# Patient Record
Sex: Male | Born: 1960
Health system: Southern US, Community
[De-identification: ages and names within clinical notes are randomized; demographics above are authoritative.]

## PROBLEM LIST (undated history)

## (undated) DIAGNOSIS — J449 Chronic obstructive pulmonary disease, unspecified: Secondary | ICD-10-CM

## (undated) DIAGNOSIS — E785 Hyperlipidemia, unspecified: Secondary | ICD-10-CM

## (undated) DIAGNOSIS — T8859XA Other complications of anesthesia, initial encounter: Secondary | ICD-10-CM

## (undated) DIAGNOSIS — T4145XA Adverse effect of unspecified anesthetic, initial encounter: Secondary | ICD-10-CM

## (undated) DIAGNOSIS — I10 Essential (primary) hypertension: Secondary | ICD-10-CM

## (undated) HISTORY — DX: Essential (primary) hypertension: I10

## (undated) HISTORY — DX: Chronic obstructive pulmonary disease, unspecified: J44.9

## (undated) HISTORY — DX: Hyperlipidemia, unspecified: E78.5

---
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
## 2004-07-11 ENCOUNTER — Ambulatory Visit: Payer: Self-pay | Admitting: Internal Medicine

## 2009-06-20 ENCOUNTER — Ambulatory Visit: Payer: Self-pay | Admitting: Internal Medicine

## 2009-06-20 DIAGNOSIS — J449 Chronic obstructive pulmonary disease, unspecified: Secondary | ICD-10-CM | POA: Insufficient documentation

## 2009-06-27 ENCOUNTER — Encounter: Payer: Self-pay | Admitting: Internal Medicine

## 2009-07-18 ENCOUNTER — Ambulatory Visit: Payer: Self-pay | Admitting: Internal Medicine

## 2010-04-09 NOTE — Assessment & Plan Note (Signed)
Summary: Pulmonary/ new pt eval for cough > sob > try symbicort 160   Primary Provider/Referring Provider:  none  CC:  Dyspnea.  Pt c/o increased SOB x several wks.  He states that he gets SOB during the day with or without activity.  .  History of Present Illness: 50 yowm quit smoking April 2011  07/2004 pulm eval for severe cough rec prilosec at onset cough and stop smoking > did neither.  June 20, 2009 cc sev years severe variably prod cough and sob,  use proaire and sometimes  advair with variable control  then worse x one month now advair daily and sev times a day proaire also abx also prednisone shots > not convinced he's better with sob sometimes sitting still, never while sleeping,  overall activity  better since quit smoking including yardwork as recently as April 10,   assoc with heartburn but no ex or pleuritic cp.  no purulent sputum.  Pt also denies any obvious fluctuation in symptoms with weather or environmental change or other alleviating or aggravating factors.       Current Medications (verified): 1)  Advair Diskus 250-50 Mcg/dose Aepb (Fluticasone-Salmeterol) .Marland Kitchen.. 1 Puff Two Times A Day 2)  Proair Hfa 108 (90 Base) Mcg/act Aers (Albuterol Sulfate) .... 2 Puffs Every 4-6 Hours As Needed  Allergies (verified): No Known Drug Allergies  Past History:  Past Medical History: COPD    - Alpha 1 genotype sent June 20, 2009 >>  Family History: Prostate CA- Father Heart dz- Father  Social History: Married with no children Barber ETOH daily Former smoker.  Quit 06-15-09.  Smoked for 35 yrs up to 2 ppd.  Review of Systems       The patient complains of shortness of breath with activity, shortness of breath at rest, productive cough, acid heartburn, nasal congestion/difficulty breathing through nose, and sneezing.  The patient denies non-productive cough, coughing up blood, chest pain, irregular heartbeats, indigestion, loss of appetite, weight change, abdominal pain,  difficulty swallowing, sore throat, tooth/dental problems, headaches, itching, ear ache, anxiety, depression, hand/feet swelling, joint stiffness or pain, rash, change in color of mucus, and fever.    Vital Signs:  Patient profile:   50 year old male Height:      70 inches Weight:      142.13 pounds BMI:     20.47 O2 Sat:      95 % on Room air Temp:     97.6 degrees F oral Pulse rate:   71 / minute BP sitting:   138 / 76  (left arm)  Vitals Entered By: Vernie Murders (June 20, 2009 1:42 PM)  O2 Flow:  Room air  Physical Exam  Additional Exam:  anxious wm nad  wt 142 June 20, 2009 HEENT mild turbinate edema.  Oropharynx no thrush or excess pnd or cobblestoning.  No JVD or cervical adenopathy. Mild accessory muscle hypertrophy. Trachea midline, nl thryroid. Chest was hyperinflated by percussion with diminished breath sounds and moderate increased exp time without wheeze. Hoover sign positive at mid inspiration. Regular rate and rhythm without murmur gallop or rub or increase P2 or edema.  Abd: no hsm, nl excursion. Ext warm without cyanosis or clubbing.     CXR  Procedure date:  06/20/2009  Findings:      Comparison: 07/11/2004.   Findings: Normal cardiomediastinal silhouette.  Hyperaeration of the lungs.  No acute pulmonary process.  Mild spondylosis of the dorsal spine.   IMPRESSION: COPD.  No acute  chest findings  Impression & Recommendations:  Problem # 1:  COPD UNSPECIFIED (ICD-496) DDX of  difficult airways managment all start with A and  include Adherence, Ace Inhibitors, Acid Reflux, Active Sinus Disease, Alpha 1 Antitripsin deficiency, Anxiety masquerading as Airways dz,  ABPA,  allergy(esp in young), Aspiration (esp in elderly), Adverse effects of DPI,  Active smokers, plus one B  = Beta blocker use..    Adherence:  did not follow prev recs to start ppi at onset of cough but did reportedly stop smoking  Adverse effects of dpi :  having breakthru symptoms on  advair  I spent extra time with the patient today explaining optimal mdi  technique.  This improved from  25 -75% June 20, 2009   Medications Added to Medication List This Visit: 1)  Advair Diskus 250-50 Mcg/dose Aepb (Fluticasone-salmeterol) .Marland Kitchen.. 1 puff two times a day 2)  Proair Hfa 108 (90 Base) Mcg/act Aers (Albuterol sulfate) .... 2 puffs every 4-6 hours as needed 3)  Symbicort 160-4.5 Mcg/act Aero (Budesonide-formoterol fumarate) .... 2 puffs first thing  in am and 2 puffs again in pm about 12 hours later 4)  Dexilant 60 Mg Cpdr (Dexlansoprazole) .... Take  one 30-60 min before first meal of the day  Complete Medication List: 1)  Symbicort 160-4.5 Mcg/act Aero (Budesonide-formoterol fumarate) .... 2 puffs first thing  in am and 2 puffs again in pm about 12 hours later 2)  Dexilant 60 Mg Cpdr (Dexlansoprazole) .... Take  one 30-60 min before first meal of the day  Other Orders: New Patient Level V (74259) T-2 View CXR (71020TC)  Patient Instructions: 1)  Stop proaire and advair 2)  Symbicort 160 2 puffs first thing  in am and 2 puffs again in pm about 12 hours later  3)  Dexilant 60 Take  one 30-60 min before first meal of the day  4)  Work on inhaler technique:  relax and blow all the way out then take a nice smooth deep breath back in, triggering the inhaler at same time you start breathing in 5)  Please schedule a follow-up appointment in 4 weeks with PFt's 6)  GERD (REFLUX)  is a common cause of respiratory symptoms. It commonly presents without heartburn and can be treated with medication, but also with lifestyle changes including avoidance of late meals, excessive alcohol, smoking cessation, and avoid fatty foods, chocolate, peppermint, colas, red wine, and acidic juices such as orange juice. NO MINT OR MENTHOL PRODUCTS SO NO COUGH DROPS  7)  USE SUGARLESS CANDY INSTEAD (jolley ranchers)  8)  NO OIL BASED VITAMINS

## 2010-04-09 NOTE — Assessment & Plan Note (Signed)
Summary: Pulmonary/ summary f/u ov hfa now 90% after coaching   Primary Provider/Referring Provider:  none  CC:  4 wk followup with PFT's.  Pt states that his breathing is much improved on symbicort.  He denies any complaints today.Marland Kitchen  History of Present Illness: 55 yowm quit smoking April 2011 with Moderate COPD by PFT's Jul 18, 2009   07/2004 pulm eval for severe cough rec prilosec at onset cough and stop smoking > did neither.  June 20, 2009 cc sev years severe variably prod cough and sob,  use proaire and sometimes  advair with variable control  then worse x one month now advair daily and sev times a day proaire also abx also prednisone shots > not convinced he's better with sob sometimes sitting still, never while sleeping,  overall activity  better since quit smoking including yardwork as recently as April 10,   assoc with heartburn but no ex or pleuritic cp.  no purulent sputum.  Stop proaire and advair Symbicort 160 2 puffs first thing  in am and 2 puffs again in pm about 12 hours later  Dexilant 60 Take  one 30-60 min before first meal of the day  Work on inhaler technique:  relax and blow all the way out then take a nice smooth deep breath back in, triggering the inhaler at same time you start breathing in  Jul 18, 2009 4 wk followup with PFT's.  Pt states that his breathing is much improved on symbicort.  He denies any complaints today. not consistent with either ppi or symbicort but when used symbicort even if just took one puff did not have the tightness in chest with exertion.  Pt denies any significant sore throat, dysphagia, itching, sneezing,  nasal congestion or excess secretions,  fever, chills, sweats, unintended wt loss, pleuritic or exertional cp, hempoptysis, change in activity tolerance  orthopnea pnd or leg swelling Pt also denies any obvious fluctuation in symptoms with weather or environmental change or other alleviating or aggravating factors.       Current Medications  (verified): 1)  Symbicort 160-4.5 Mcg/act  Aero (Budesonide-Formoterol Fumarate) .... 2 Puffs First Thing  in Am and 2 Puffs Again in Pm About 12 Hours Later 2)  Dexilant 60 Mg Cpdr (Dexlansoprazole) .... Take  One 30-60 Min Before First Meal of The Day  Allergies (verified): No Known Drug Allergies  Past History:  Past Medical History: COPD    - Alpha 1 genotype sent June 20, 2009 >> negative     - PFT's Jul 18, 2009 FEV1 2.70 (76) ratio 55 and no response to B2 p am symbicort and DLC0 75%  Vital Signs:  Patient profile:   50 year old male Weight:      142 pounds O2 Sat:      98 % on Room air Temp:     98.6 degrees F oral Pulse rate:   66 / minute BP sitting:   130 / 70  (left arm)  Vitals Entered By: Vernie Murders (Jul 18, 2009 1:26 PM)  O2 Flow:  Room air  Physical Exam  Additional Exam:  anxious wm nad  wt 142 June 20, 2009 > 142 Jul 18, 2009  HEENT mild turbinate edema.  Oropharynx no thrush or excess pnd or cobblestoning.  No JVD or cervical adenopathy. Mild accessory muscle hypertrophy. Trachea midline, nl thryroid. Chest was hyperinflated by percussion with diminished breath sounds and moderate increased exp time without wheeze. Hoover sign positive at mid inspiration.  Regular rate and rhythm without murmur gallop or rub or increase P2 or edema.  Abd: no hsm, nl excursion. Ext warm without cyanosis or clubbing.     Impression & Recommendations:  Problem # 1:  COPD UNSPECIFIED (ICD-496) GOLD II severity with reversibility suggested by symbicort dependency whereas didn't think advair or ppi really helped symptoms.   I took this opportunity to educate the patient regarding the consequences of smoking in airway disorders based on all the data we have from the multiple national lung health studies indicating that smoking cessation, not choice of inhalers or physicians, is the most important aspect of care.    Ok to take symbicort as needed in this setting, the way it is  used in [place].  I spent extra time with the patient today explaining optimal mdi  technique.  This improved from  75-90%  Other Orders: Est. Patient Level IV (16109)  Clinical Reports Reviewed:  CXR:  06/20/2009: CXR Results:  Comparison: 07/11/2004.   Findings: Normal cardiomediastinal silhouette.  Hyperaeration of the lungs.  No acute pulmonary process.  Mild spondylosis of the dorsal spine.   IMPRESSION: COPD.  No acute chest findings   Patient Instructions: 1)  Stop dexilant 2)  GERD (REFLUX)  is a common cause of respiratory symptoms. It commonly presents without heartburn and can be treated with medication, but also with lifestyle changes including avoidance of late meals, excessive alcohol, smoking cessation, and avoid fatty foods, chocolate, peppermint, colas, red wine, and acidic juices such as orange juice. NO MINT OR MENTHOL PRODUCTS SO NO COUGH DROPS  3)  USE SUGARLESS CANDY INSTEAD (jolley ranchers)  4)  NO OIL BASED VITAMINS 5)  Continue Symbicort 160 2 puffs first thing  in am and 2 puffs again in pm about 12 hours later  6)  The key is do not smoke 7)  Return here if not satisfied with symbicort Prescriptions: SYMBICORT 160-4.5 MCG/ACT  AERO (BUDESONIDE-FORMOTEROL FUMARATE) 2 puffs first thing  in am and 2 puffs again in pm about 12 hours later  #1 x 11   Entered and Authorized by:   Nyoka Cowden MD   Signed by:   Nyoka Cowden MD on 07/18/2009   Method used:   Electronically to        [facility]  [phone]* (retail)       [address] [place], [place]  [address]       Ph: [phone] or [phone]       Fax: [phone]   RxID:   [id number]

## 2010-04-09 NOTE — Miscellaneous (Signed)
Summary: Orders Update pft charges  Clinical Lists Changes  Orders: Added new Service order of Carbon Monoxide diffusing w/capacity (94720) - Signed Added new Service order of Lung Volumes (94240) - Signed Added new Service order of Spirometry (Pre & Post) (94060) - Signed 

## 2010-07-10 ENCOUNTER — Telehealth: Payer: Self-pay | Admitting: Internal Medicine

## 2010-07-10 MED ORDER — BUDESONIDE-FORMOTEROL FUMARATE 160-4.5 MCG/ACT IN AERO: 2.0000 | INHALATION_SPRAY | Freq: Two times a day (BID) | RESPIRATORY_TRACT | Status: DC

## 2010-07-10 NOTE — Telephone Encounter (Signed)
Pt phoned stated that he was returning a call to triage he can be reached at [phone].Vedia Coffer

## 2010-07-10 NOTE — Telephone Encounter (Signed)
lmomtcb x1. Pt was last seen 07/18/09

## 2010-07-10 NOTE — Telephone Encounter (Signed)
Spoke w/ Stephen Davila and advised him rx was sent to pharmacy. Also advised sine last time Stephen Davila saw Dr. Sherene Sires was 05/12/09 he needed to make an OV for further refills. Stephen Davila is coming in 08/01/10 at 11:45. Nothing further was needed

## 2010-07-31 ENCOUNTER — Encounter: Payer: Self-pay | Admitting: Internal Medicine

## 2010-08-01 ENCOUNTER — Ambulatory Visit (INDEPENDENT_AMBULATORY_CARE_PROVIDER_SITE_OTHER): Payer: BC Managed Care – PPO | Admitting: Internal Medicine

## 2010-08-01 ENCOUNTER — Encounter: Payer: Self-pay | Admitting: Internal Medicine

## 2010-08-01 ENCOUNTER — Ambulatory Visit (INDEPENDENT_AMBULATORY_CARE_PROVIDER_SITE_OTHER)
Admission: RE | Admit: 2010-08-01 | Discharge: 2010-08-01 | Disposition: A | Discharge status: Home / Self Care | Payer: BC Managed Care – PPO | Source: Ambulatory Visit | Attending: Internal Medicine | Admitting: Internal Medicine

## 2010-08-01 VITALS — BP 110/60 | HR 80 | Temp 98.0°F | Ht 71.0 in | Wt 164.0 lb

## 2010-08-01 DIAGNOSIS — J449 Chronic obstructive pulmonary disease, unspecified: Secondary | ICD-10-CM

## 2010-08-01 DIAGNOSIS — J4489 Other specified chronic obstructive pulmonary disease: Secondary | ICD-10-CM

## 2010-08-01 MED ORDER — BUDESONIDE-FORMOTEROL FUMARATE 160-4.5 MCG/ACT IN AERO: 2.0000 | INHALATION_SPRAY | Freq: Two times a day (BID) | RESPIRATORY_TRACT | Status: DC

## 2010-08-01 NOTE — Progress Notes (Signed)
Subjective:     Patient ID: Stephen Davila, male   DOB: 1960-08-11, 50 y.o.   MRN: [id number]  HPI  64 yowm quit smoking April 2011 with Moderate COPD by PFT's Jul 18, 2009   07/2004 pulm eval for severe cough rec prilosec at onset cough and stop smoking > did neither.   June 20, 2009 cc sev years severe variably prod cough and sob, use proaire and sometimes advair with variable control then worse x one month now advair daily and sev times a day proaire also abx also prednisone shots > not convinced he's better with sob sometimes sitting still, never while sleeping, overall activity better since quit smoking including yardwork as recently as April 10, assoc with heartburn but no ex or pleuritic cp. no purulent sputum. Stop proaire and advair  Symbicort 160 2 puffs first thing in am and 2 puffs again in pm about 12 hours later  Dexilant 60 Take one 30-60 min before first meal of the day  Work on inhaler technique: relax and blow all the way out then take a nice smooth deep breath back in, triggering the inhaler at same time you start breathing in   Jul 18, 2009 4 wk followup with PFT's. Pt states that his breathing is much improved on symbicort. He denies any complaints today. not consistent with either ppi or symbicort but when used symbicort even if just took one puff did not have the tightness in chest with exertion.  rec   Stop dexilant   GERD (REFLUX) diet  Continue Symbicort 160 2 puffs first thing in am and 2 puffs again in pm about 12 hours later   The key is do not smoke   Return here if not satisfied with symbicort    08/01/2010 ov/ Wert satisfied with symbicort, occ misses the pm dose and notes occ am cough/congestion the next day only.  Not limited by sob.  Pt denies any significant sore throat, dysphagia, itching, sneezing,  nasal congestion or excess/ purulent secretions,  fever, chills, sweats, unintended wt loss, pleuritic or exertional cp, hempoptysis, orthopnea pnd or  leg swelling.    Also denies any obvious fluctuation of symptoms with weather or environmental changes or other aggravating or alleviating factors.  Sleeping ok without nocturnal  or early am exac of resp c/o's       Past Medical History:  COPD  - Alpha 1 genotype sent June 20, 2009 >> MM - PFT's Jul 18, 2009 FEV1 2.70 (76) ratio 55 and no response to B2 p am symbicort and DLC0 75%       Review of Systems     Objective:   Physical Exam  anxious wm nad  wt 142 June 20, 2009 > 142 Jul 18, 2009 > 164 08/01/2010  HEENT mild turbinate edema. Oropharynx no thrush or excess pnd or cobblestoning. No JVD or cervical adenopathy. Mild accessory muscle hypertrophy. Trachea midline, nl thryroid. Chest was hyperinflated by percussion with diminished breath sounds and moderate increased exp time without wheeze. Hoover sign positive at mid inspiration. Regular rate and rhythm without murmur gallop or rub or increase P2 or edema. Abd: no hsm, nl excursion. Ext warm without cyanosis or clubbing.     cxr 08/01/2010  Comparison: 06/20/2009  Findings: Stable evidence of mild COPD. No infiltrate, nodule,  edema or effusion identified. Heart size is normal.  IMPRESSION:  Stable COPD.  Assessment:      Plan:

## 2010-08-01 NOTE — Patient Instructions (Addendum)
Continue symbicort 160 2 puffs every 12 hours if needed for cough wheeze or short of breath  Maintaining off cigarettes is the key to longterm lung health - congratulations!  Rec establish with primary care doctor

## 2010-08-01 NOTE — Assessment & Plan Note (Signed)
He has no sign symptoms with GOLD II COPD s/p remote smoking cessation so unlikely he ever will  I reviewed the Flethcher curve with patient that basically indicates  if you quit smoking when your best day FEV1 is still well preserved it is highly unlikely you will progress to severe disease and informed the patient there was no medication on the market that has proven to change the curve or the likelihood of progression.  Therefore  maintaining abstinence from smoking  is the most important aspect of care, not choice of inhalers or for that matter, doctors.    Ok to use symbicort the way he is, namely prn  The proper method of use, as well as anticipated side effects, of this metered-dose inhaler are discussed and demonstrated to the patient. Improved to 90% with coaching

## 2010-08-07 ENCOUNTER — Telehealth: Payer: Self-pay | Admitting: Internal Medicine

## 2010-08-07 NOTE — Telephone Encounter (Signed)
Per Leslie's in-basket, she did indeed call patient to inform him of cxr results.  Called spoke with patient, advised of cxr results as stated by MW.  Pt verbalized his understanding.

## 2011-09-17 ENCOUNTER — Telehealth: Payer: Self-pay | Admitting: Internal Medicine

## 2011-09-17 DIAGNOSIS — J449 Chronic obstructive pulmonary disease, unspecified: Secondary | ICD-10-CM

## 2011-09-17 MED ORDER — BUDESONIDE-FORMOTEROL FUMARATE 160-4.5 MCG/ACT IN AERO: 2.0000 | INHALATION_SPRAY | Freq: Two times a day (BID) | RESPIRATORY_TRACT | Status: DC

## 2011-09-17 NOTE — Telephone Encounter (Signed)
Pls advise if okay to send refills for Symbicort until pt can be seen on 10/29/11 by Dr. Sanda Linger. Pt last seen in this office May 2012.

## 2011-09-17 NOTE — Telephone Encounter (Signed)
Refills sent. Pt is aware. Jennifer Castillo, CMA  

## 2011-09-17 NOTE — Telephone Encounter (Signed)
Ok for refills x 2 months on symbicort

## 2011-10-29 ENCOUNTER — Other Ambulatory Visit (INDEPENDENT_AMBULATORY_CARE_PROVIDER_SITE_OTHER): Payer: BC Managed Care – PPO

## 2011-10-29 ENCOUNTER — Encounter: Payer: Self-pay | Admitting: Internal Medicine

## 2011-10-29 ENCOUNTER — Ambulatory Visit (INDEPENDENT_AMBULATORY_CARE_PROVIDER_SITE_OTHER): Payer: BC Managed Care – PPO | Admitting: Internal Medicine

## 2011-10-29 VITALS — BP 118/74 | HR 68 | Temp 98.2°F | Resp 16 | Ht 71.0 in | Wt 163.5 lb

## 2011-10-29 DIAGNOSIS — Z0000 Encounter for general adult medical examination without abnormal findings: Secondary | ICD-10-CM

## 2011-10-29 DIAGNOSIS — Z23 Encounter for immunization: Secondary | ICD-10-CM

## 2011-10-29 DIAGNOSIS — J4489 Other specified chronic obstructive pulmonary disease: Secondary | ICD-10-CM

## 2011-10-29 DIAGNOSIS — Z13220 Encounter for screening for lipoid disorders: Secondary | ICD-10-CM

## 2011-10-29 DIAGNOSIS — J449 Chronic obstructive pulmonary disease, unspecified: Secondary | ICD-10-CM

## 2011-10-29 LAB — URINALYSIS, ROUTINE W REFLEX MICROSCOPIC
Bilirubin Urine: NEGATIVE
Ketones, ur: NEGATIVE
Leukocytes, UA: NEGATIVE
Urobilinogen, UA: 0.2 (ref 0.0–1.0)

## 2011-10-29 LAB — LIPID PANEL
Cholesterol: 230 mg/dL — ABNORMAL HIGH (ref 0–200)
Total CHOL/HDL Ratio: 4
VLDL: 41.4 mg/dL — ABNORMAL HIGH (ref 0.0–40.0)

## 2011-10-29 LAB — CBC WITH DIFFERENTIAL/PLATELET
Basophils Absolute: 0.2 10*3/uL — ABNORMAL HIGH (ref 0.0–0.1)
Eosinophils Absolute: 0.4 10*3/uL (ref 0.0–0.7)
HCT: 46.5 % (ref 39.0–52.0)
Hemoglobin: 15.8 g/dL (ref 13.0–17.0)
Lymphs Abs: 2.7 10*3/uL (ref 0.7–4.0)
MCHC: 33.9 g/dL (ref 30.0–36.0)
MCV: 97.2 fl (ref 78.0–100.0)
Neutro Abs: 4.9 10*3/uL (ref 1.4–7.7)
RDW: 12.3 % (ref 11.5–14.6)

## 2011-10-29 LAB — COMPREHENSIVE METABOLIC PANEL
ALT: 30 U/L (ref 0–53)
AST: 24 U/L (ref 0–37)
Alkaline Phosphatase: 50 U/L (ref 39–117)
Chloride: 102 mEq/L (ref 96–112)
Creatinine, Ser: 1.1 mg/dL (ref 0.4–1.5)
Total Bilirubin: 0.8 mg/dL (ref 0.3–1.2)

## 2011-10-29 LAB — TSH: TSH: 1.06 u[IU]/mL (ref 0.35–5.50)

## 2011-10-29 LAB — LDL CHOLESTEROL, DIRECT: Direct LDL: 136.4 mg/dL

## 2011-10-29 MED ORDER — BUDESONIDE-FORMOTEROL FUMARATE 160-4.5 MCG/ACT IN AERO: 2.0000 | INHALATION_SPRAY | Freq: Two times a day (BID) | RESPIRATORY_TRACT | Status: DC

## 2011-10-29 NOTE — Patient Instructions (Signed)
Health Maintenance, Males A healthy lifestyle and preventative care can promote health and wellness.  Maintain regular health, dental, and eye exams.   Eat a healthy diet. Foods like vegetables, fruits, whole grains, low-fat dairy products, and lean protein foods contain the nutrients you need without too many calories. Decrease your intake of foods high in solid fats, added sugars, and salt. Get information about a proper diet from your caregiver, if necessary.   Regular physical exercise is one of the most important things you can do for your health. Most adults should get at least 150 minutes of moderate-intensity exercise (any activity that increases your heart rate and causes you to sweat) each week. In addition, most adults need muscle-strengthening exercises on 2 or more days a week.    Maintain a healthy weight. The body mass index (BMI) is a screening tool to identify possible weight problems. It provides an estimate of body fat based on height and weight. Your caregiver can help determine your BMI, and can help you achieve or maintain a healthy weight. For adults 20 years and older:   A BMI below 18.5 is considered underweight.   A BMI of 18.5 to 24.9 is normal.   A BMI of 25 to 29.9 is considered overweight.   A BMI of 30 and above is considered obese.   Maintain normal blood lipids and cholesterol by exercising and minimizing your intake of saturated fat. Eat a balanced diet with plenty of fruits and vegetables. Blood tests for lipids and cholesterol should begin at age 20 and be repeated every 5 years. If your lipid or cholesterol levels are high, you are over 50, or you are a high risk for heart disease, you may need your cholesterol levels checked more frequently.Ongoing high lipid and cholesterol levels should be treated with medicines, if diet and exercise are not effective.   If you smoke, find out from your caregiver how to quit. If you do not use tobacco, do not start.    If you choose to drink alcohol, do not exceed 2 drinks per day. One drink is considered to be 12 ounces (355 mL) of beer, 5 ounces (148 mL) of wine, or 1.5 ounces (44 mL) of liquor.   Avoid use of street drugs. Do not share needles with anyone. Ask for help if you need support or instructions about stopping the use of drugs.   High blood pressure causes heart disease and increases the risk of stroke. Blood pressure should be checked at least every 1 to 2 years. Ongoing high blood pressure should be treated with medicines if weight loss and exercise are not effective.   If you are 45 to 53 years old, ask your caregiver if you should take aspirin to prevent heart disease.   Diabetes screening involves taking a blood sample to check your fasting blood sugar level. This should be done once every 3 years, after age 45, if you are within normal weight and without risk factors for diabetes. Testing should be considered at a younger age or be carried out more frequently if you are overweight and have at least 1 risk factor for diabetes.   Colorectal cancer can be detected and often prevented. Most routine colorectal cancer screening begins at the age of 50 and continues through age 75. However, your caregiver may recommend screening at an earlier age if you have risk factors for colon cancer. On a yearly basis, your caregiver may provide home test kits to check for hidden   blood in the stool. Use of a small camera at the end of a tube, to directly examine the colon (sigmoidoscopy or colonoscopy), can detect the earliest forms of colorectal cancer. Talk to your caregiver about this at age 50, when routine screening begins. Direct examination of the colon should be repeated every 5 to 10 years through age 75, unless early forms of pre-cancerous polyps or small growths are found.   Hepatitis C blood testing is recommended for all people born from 1945 through 1965 and any individual with known risks for  hepatitis C.   Healthy men should no longer receive prostate-specific antigen (PSA) blood tests as part of routine cancer screening. Consult with your caregiver about prostate cancer screening.   Testicular cancer screening is not recommended for adolescents or adult males who have no symptoms. Screening includes self-exam, caregiver exam, and other screening tests. Consult with your caregiver about any symptoms you have or any concerns you have about testicular cancer.   Practice safe sex. Use condoms and avoid high-risk sexual practices to reduce the spread of sexually transmitted infections (STIs).   Use sunscreen with a sun protection factor (SPF) of 30 or greater. Apply sunscreen liberally and repeatedly throughout the day. You should seek shade when your shadow is shorter than you. Protect yourself by wearing long sleeves, pants, a wide-brimmed hat, and sunglasses year round, whenever you are outdoors.   Notify your caregiver of new moles or changes in moles, especially if there is a change in shape or color. Also notify your caregiver if a mole is larger than the size of a pencil eraser.   A one-time screening for abdominal aortic aneurysm (AAA) and surgical repair of large AAAs by sound wave imaging (ultrasonography) is recommended for ages 65 to 75 years who are current or former smokers.   Stay current with your immunizations.  Document Released: 08/23/2007 Document Revised: 02/13/2011 Document Reviewed: 07/22/2010 [employer] Patient Information 2012 [employer]. 

## 2011-10-29 NOTE — Progress Notes (Signed)
  Subjective:    Patient ID: Stephen Davila, male    DOB: 1960-10-12, 51 y.o.   MRN: [id number]  HPI  New to me for a complete physical, he feels well and offers no complaints.  Review of Systems  Constitutional: Negative.   HENT: Negative.   Eyes: Negative.   Respiratory: Negative for cough, chest tightness, shortness of breath, wheezing and stridor.   Cardiovascular: Negative for chest pain, palpitations and leg swelling.  Gastrointestinal: Negative.   Genitourinary: Negative.  Negative for dysuria, urgency, frequency, decreased urine volume and difficulty urinating.  Musculoskeletal: Negative.   Skin: Negative.   Neurological: Negative.   Hematological: Negative.   Psychiatric/Behavioral: Negative.        Objective:   Physical Exam  Vitals reviewed. Constitutional: He is oriented to person, place, and time. He appears well-developed and well-nourished. No distress.  HENT:  Head: Normocephalic and atraumatic.  Mouth/Throat: Oropharynx is clear and moist. No oropharyngeal exudate.  Eyes: Conjunctivae are normal. Right eye exhibits no discharge. Left eye exhibits no discharge. No scleral icterus.  Neck: Normal range of motion. Neck supple. No JVD present. No tracheal deviation present. No thyromegaly present.  Cardiovascular: Normal rate, regular rhythm, normal heart sounds and intact distal pulses.  Exam reveals no gallop and no friction rub.   No murmur heard. Pulmonary/Chest: Effort normal and breath sounds normal. No stridor. No respiratory distress. He has no wheezes. He has no rales. He exhibits no tenderness.  Abdominal: Soft. Bowel sounds are normal. He exhibits no distension and no mass. There is no tenderness. There is no rebound and no guarding. Hernia confirmed negative in the right inguinal area and confirmed negative in the left inguinal area.  Genitourinary: Rectum normal, prostate normal, testes normal and penis normal. Rectal exam shows no external  hemorrhoid, no internal hemorrhoid, no fissure, no mass, no tenderness and anal tone normal. Guaiac negative stool. Prostate is not enlarged and not tender. Right testis shows no mass, no swelling and no tenderness. Right testis is descended. Left testis shows no mass, no swelling and no tenderness. Left testis is descended. Circumcised. No penile erythema or penile tenderness. No discharge found.  Musculoskeletal: Normal range of motion. He exhibits no edema.  Lymphadenopathy:    He has no cervical adenopathy.       Right: No inguinal adenopathy present.       Left: No inguinal adenopathy present.  Neurological: He is oriented to person, place, and time.  Skin: Skin is warm, dry and intact. No abrasion and no rash noted. He is not diaphoretic. No pallor.  Psychiatric: He has a normal mood and affect. His behavior is normal. Judgment and thought content normal.          Assessment & Plan:

## 2011-10-29 NOTE — Assessment & Plan Note (Signed)
Exam done, vaccines were updated, labs ordered, pt ed material was given 

## 2011-10-30 ENCOUNTER — Encounter: Payer: Self-pay | Admitting: Internal Medicine

## 2012-03-06 ENCOUNTER — Ambulatory Visit (INDEPENDENT_AMBULATORY_CARE_PROVIDER_SITE_OTHER): Payer: BC Managed Care – PPO | Admitting: Physician Assistant

## 2012-03-06 VITALS — BP 159/91 | HR 87 | Temp 100.0°F | Resp 16 | Ht 71.0 in | Wt 168.2 lb

## 2012-03-06 DIAGNOSIS — R509 Fever, unspecified: Secondary | ICD-10-CM

## 2012-03-06 DIAGNOSIS — J029 Acute pharyngitis, unspecified: Secondary | ICD-10-CM

## 2012-03-06 DIAGNOSIS — R05 Cough: Secondary | ICD-10-CM

## 2012-03-06 DIAGNOSIS — J329 Chronic sinusitis, unspecified: Secondary | ICD-10-CM

## 2012-03-06 DIAGNOSIS — R059 Cough, unspecified: Secondary | ICD-10-CM

## 2012-03-06 LAB — POCT CBC
Granulocyte percent: 75.6 %G (ref 37–80)
HCT, POC: 50 % (ref 43.5–53.7)
Hemoglobin: 16 g/dL (ref 14.1–18.1)
Lymph, poc: 1.4 (ref 0.6–3.4)
MCH, POC: 32.4 pg — AB (ref 27–31.2)
MCHC: 32 g/dL (ref 31.8–35.4)
MCV: 101.3 fL — AB (ref 80–97)
MID (cbc): 1 — AB (ref 0–0.9)
MPV: 8.3 fL (ref 0–99.8)
POC Granulocyte: 7.5 — AB (ref 2–6.9)
POC LYMPH PERCENT: 14.4 %L (ref 10–50)
POC MID %: 10 %M (ref 0–12)
Platelet Count, POC: 296 10*3/uL (ref 142–424)
RBC: 4.94 M/uL (ref 4.69–6.13)
RDW, POC: 12.5 %
WBC: 9.9 10*3/uL (ref 4.6–10.2)

## 2012-03-06 LAB — POCT INFLUENZA A/B
Influenza A, POC: NEGATIVE
Influenza B, POC: NEGATIVE

## 2012-03-06 MED ORDER — AZITHROMYCIN 250 MG PO TABS: ORAL_TABLET | ORAL | Status: DC

## 2012-03-06 MED ORDER — IPRATROPIUM BROMIDE 0.03 % NA SOLN: 2.0000 | Freq: Two times a day (BID) | NASAL | Status: DC

## 2012-03-06 NOTE — Progress Notes (Signed)
  Subjective:    Patient ID: Stephen Davila, male    DOB: Jul 14, 1960, 51 y.o.   MRN: [id number]  HPI 51 year old male presents with 2 day history of nasal congestion, sinus tenderness, sore throat, and cough productive of mucous.  Denies SOB, wheezing, nausea, vomiting, headache, otalgia, or dizziness. Has not used any OTC medications for this.  Does take Symbicort daily for COPD.      Review of Systems  Constitutional: Positive for fever. Negative for chills.  HENT: Positive for congestion, sore throat, rhinorrhea and postnasal drip. Negative for ear pain and neck pain.   Respiratory: Positive for cough. Negative for chest tightness, shortness of breath and wheezing.   Gastrointestinal: Negative for nausea, vomiting and abdominal pain.  Skin: Negative for rash.  Neurological: Negative for headaches.       Objective:   Physical Exam  Constitutional: He is oriented to person, place, and time. He appears well-developed and well-nourished.  HENT:  Head: Normocephalic and atraumatic.  Right Ear: Hearing, tympanic membrane, external ear and ear canal normal.  Left Ear: Hearing, external ear and ear canal normal.  Mouth/Throat: Uvula is midline, oropharynx is clear and moist and mucous membranes are normal. No oropharyngeal exudate.  Eyes: Conjunctivae normal are normal.  Neck: Normal range of motion.  Cardiovascular: Normal rate, regular rhythm and normal heart sounds.   Pulmonary/Chest: Effort normal and breath sounds normal.  Lymphadenopathy:    He has no cervical adenopathy.  Neurological: He is alert and oriented to person, place, and time.  Psychiatric: He has a normal mood and affect. His behavior is normal. Thought content normal.          Assessment & Plan:   1. Cough  POCT Influenza A/B, POCT CBC, azithromycin (ZITHROMAX) 250 MG tablet  2. Fever, unspecified  POCT Influenza A/B, POCT CBC  3. Acute pharyngitis  POCT Influenza A/B  4. Sinusitis  ipratropium  (ATROVENT) 0.03 % nasal spray   Will cover with Zpack  Delsym OTC bid for cough mucinex as directed Atrovent NS bid to help with congestion Follow up if symptoms worsen or fail to improve.

## 2012-09-18 ENCOUNTER — Ambulatory Visit (INDEPENDENT_AMBULATORY_CARE_PROVIDER_SITE_OTHER): Payer: BC Managed Care – PPO | Admitting: Family Medicine

## 2012-09-18 ENCOUNTER — Ambulatory Visit: Payer: BC Managed Care – PPO

## 2012-09-18 VITALS — BP 122/80 | HR 70 | Temp 98.4°F | Resp 18 | Wt 171.0 lb

## 2012-09-18 DIAGNOSIS — M25519 Pain in unspecified shoulder: Secondary | ICD-10-CM

## 2012-09-18 DIAGNOSIS — M25512 Pain in left shoulder: Secondary | ICD-10-CM

## 2012-09-18 DIAGNOSIS — M542 Cervicalgia: Secondary | ICD-10-CM

## 2012-09-18 DIAGNOSIS — M5412 Radiculopathy, cervical region: Secondary | ICD-10-CM

## 2012-09-18 MED ORDER — PREDNISONE 20 MG PO TABS: ORAL_TABLET | ORAL | Status: DC

## 2012-09-18 MED ORDER — KETOROLAC TROMETHAMINE 60 MG/2ML IM SOLN
60.0000 mg | Freq: Once | INTRAMUSCULAR | Status: AC
  Administered 2012-09-18: 60 mg via INTRAMUSCULAR

## 2012-09-18 MED ORDER — TRAMADOL HCL 50 MG PO TABS: 50.0000 mg | ORAL_TABLET | Freq: Three times a day (TID) | ORAL | Status: DC | PRN

## 2012-09-18 NOTE — Patient Instructions (Addendum)
Your x-rays show that the degeneration between C5 and C6 has worsened slightly from the x-rays 3 years ago.  I think this is responsible for your pain.  You got an injection of pain medicine here that should last a few hours.  You can use tramadol every 8 hours as needed.  Begin the prednisone taper tomorrow morning.  I recommend making an appointment with your neurosurgeon so they can re-evaluate you as well.  If anything is worsening or not improving prior to seeing neurosurgery, please let us know.

## 2012-09-18 NOTE — Progress Notes (Signed)
Subjective:    Patient ID: Stephen Davila, male    DOB: Mar 22, 1960, 52 y.o.   MRN: [id number]  HPI   Stephen Davila is a pleasant 52 yr old male here complaining of left sided neck and left shoulder and arm pain.  He describes the pain as being new but has difficulty articulating when the pain began.  States he first noticed the pain last week but pain responded well to ibuprofen.  Yesterday the pain seems to have worsened and is no longer responding to ibuprofen.  He went to the chiropractor yesterday and still found no relief. Difficulty sleeping due to the pain.  Can't get comfortable.  Endorses fairly normal ROM though some movements cause more pain than others.  Putting the left arm above his head will relieve the pain briefly.  He states he has numbness in his left thumb x 3 yrs.  Saw a neurosurgeon 3 yrs ago who noted a possible bone spur at C5-C6.  Was told the numbness would resolve but it has note.  Paresthesias do not appear to be worsening.  He took ibuprofen 400mg  this morning.  Tried a hydrocodone but that caused him to be very nauseous.  He denies any sort of trauma to the neck/shoulder.  States he otherwise feels well today.     Review of Systems  Constitutional: Negative for fever and chills.  HENT: Negative.   Respiratory: Negative.   Cardiovascular: Negative.   Gastrointestinal: Negative.   Musculoskeletal: Positive for myalgias and arthralgias.  Skin: Negative.   Neurological: Positive for numbness (left thumb). Negative for weakness.       Objective:   Physical Exam  Vitals reviewed. Constitutional: He is oriented to person, place, and time. He appears well-developed and well-nourished. No distress.  HENT:  Head: Normocephalic and atraumatic.  Eyes: Conjunctivae are normal. No scleral icterus.  Neck: Normal range of motion.  Cardiovascular: Normal rate, regular rhythm and normal heart sounds.   Pulmonary/Chest: Effort normal and breath sounds normal. He has no  wheezes. He has no rales.  Musculoskeletal:       Right shoulder: Normal.       Left shoulder: He exhibits tenderness. He exhibits normal range of motion, no bony tenderness and no deformity.       Cervical back: He exhibits normal range of motion, no tenderness, no bony tenderness, no pain and no spasm.       Arms: Mild generalized TTP over the shoulder; no TTP over cervical spine, paraspinals, or trapezius; full AROM in all planes; strength 5/5; some increased pain with empty can test; hawkin's neg; drop arm neg  Neurological: He is alert and oriented to person, place, and time. He has normal strength. A sensory deficit (left thumb x 3 yrs) is present.  Skin: Skin is warm and dry.  Psychiatric: He has a normal mood and affect. His behavior is normal.    [facility] reading (PRIMARY) by  Dr. Clelia Croft - spondylosis C5-C6 with spurring, appears progressive from 2011 films        Assessment & Plan:  Radiculopathy of cervical spine - Plan: traMADol (ULTRAM) 50 MG tablet, predniSONE (DELTASONE) 20 MG tablet  Pain in joint, shoulder region, left - Plan: ketorolac (TORADOL) injection 60 mg, DG Cervical Spine Complete, traMADol (ULTRAM) 50 MG tablet, predniSONE (DELTASONE) 20 MG tablet  Pain, neck - Plan: traMADol (ULTRAM) 50 MG tablet, predniSONE (DELTASONE) 20 MG tablet    Stephen Davila is a 52 yr old male with new onset  neck/shoulder pain.  He has long standing numbness in his left thumb thought to be due to C5-C6 bone spur.  Paresthesias are not worsening, but plain films show progressive degeneration from last imaging in 2011.  Suspect this progression is causing his increased pain.  The shoulder joint itself is normal on exam, and he has full AROM and strength.  Will try a short prednisone taper and tramadol for pain relief as pt did not tolerate hydrocodone very well.  Encouraged him to follow up with neurosurg as he may require further intervention from them.  If worsening or not improving prior to  seeing neurosurg, pt to call or RTC.    Case discussed with Dr. Clelia Croft

## 2012-09-20 NOTE — Progress Notes (Signed)
Reviewed documentation and xray and agree w/ assessment and plan. Eva Shaw, MD MPH   

## 2012-10-01 HISTORY — PX: CERVICAL SPINE SURGERY: SHX589

## 2012-11-03 ENCOUNTER — Ambulatory Visit (INDEPENDENT_AMBULATORY_CARE_PROVIDER_SITE_OTHER): Payer: BC Managed Care – PPO

## 2012-11-03 ENCOUNTER — Telehealth: Payer: Self-pay | Admitting: Internal Medicine

## 2012-11-03 ENCOUNTER — Encounter: Payer: Self-pay | Admitting: Internal Medicine

## 2012-11-03 ENCOUNTER — Ambulatory Visit (INDEPENDENT_AMBULATORY_CARE_PROVIDER_SITE_OTHER): Payer: BC Managed Care – PPO | Admitting: Internal Medicine

## 2012-11-03 VITALS — BP 126/62 | HR 71 | Temp 98.6°F | Resp 16 | Wt 164.1 lb

## 2012-11-03 DIAGNOSIS — Z125 Encounter for screening for malignant neoplasm of prostate: Secondary | ICD-10-CM

## 2012-11-03 DIAGNOSIS — I2510 Atherosclerotic heart disease of native coronary artery without angina pectoris: Secondary | ICD-10-CM

## 2012-11-03 DIAGNOSIS — J449 Chronic obstructive pulmonary disease, unspecified: Secondary | ICD-10-CM

## 2012-11-03 DIAGNOSIS — E785 Hyperlipidemia, unspecified: Secondary | ICD-10-CM | POA: Insufficient documentation

## 2012-11-03 DIAGNOSIS — Z0000 Encounter for general adult medical examination without abnormal findings: Secondary | ICD-10-CM

## 2012-11-03 DIAGNOSIS — J4489 Other specified chronic obstructive pulmonary disease: Secondary | ICD-10-CM

## 2012-11-03 LAB — CBC WITH DIFFERENTIAL/PLATELET
Basophils Relative: 0.6 % (ref 0.0–3.0)
Eosinophils Absolute: 0.4 10*3/uL (ref 0.0–0.7)
Eosinophils Relative: 3.9 % (ref 0.0–5.0)
Hemoglobin: 14.7 g/dL (ref 13.0–17.0)
Lymphocytes Relative: 32.9 % (ref 12.0–46.0)
MCHC: 34.7 g/dL (ref 30.0–36.0)
Neutro Abs: 5.1 10*3/uL (ref 1.4–7.7)
RBC: 4.46 Mil/uL (ref 4.22–5.81)
WBC: 9.7 10*3/uL (ref 4.5–10.5)

## 2012-11-03 LAB — URINALYSIS, ROUTINE W REFLEX MICROSCOPIC
Specific Gravity, Urine: 1.015 (ref 1.000–1.030)
Total Protein, Urine: NEGATIVE
Urine Glucose: NEGATIVE

## 2012-11-03 LAB — FECAL OCCULT BLOOD, GUAIAC: Fecal Occult Blood: NEGATIVE

## 2012-11-03 MED ORDER — BUDESONIDE-FORMOTEROL FUMARATE 160-4.5 MCG/ACT IN AERO: 2.0000 | INHALATION_SPRAY | Freq: Two times a day (BID) | RESPIRATORY_TRACT | Status: DC

## 2012-11-03 MED ORDER — ROSUVASTATIN CALCIUM 10 MG PO TABS
10.0000 mg | ORAL_TABLET | Freq: Every day | ORAL | Status: DC
Start: 2012-11-03 — End: 2012-11-16

## 2012-11-03 MED ORDER — ASPIRIN EC 81 MG PO TBEC: 81.0000 mg | DELAYED_RELEASE_TABLET | Freq: Every day | ORAL | Status: DC

## 2012-11-03 NOTE — Telephone Encounter (Signed)
Patient is requesting a referral for a colonoscopy.

## 2012-11-03 NOTE — Assessment & Plan Note (Signed)
Start crestor and asa I have asked him to have an ETT to see if there is any concern for ischemia

## 2012-11-03 NOTE — Telephone Encounter (Signed)
Yes sir, he has an appointment today @ 3:15, this request came via mychart request

## 2012-11-03 NOTE — Patient Instructions (Signed)
Health Maintenance, Males A healthy lifestyle and preventative care can promote health and wellness.  Maintain regular health, dental, and eye exams.  Eat a healthy diet. Foods like vegetables, fruits, whole grains, low-fat dairy products, and lean protein foods contain the nutrients you need without too many calories. Decrease your intake of foods high in solid fats, added sugars, and salt. Get information about a proper diet from your caregiver, if necessary.  Regular physical exercise is one of the most important things you can do for your health. Most adults should get at least 150 minutes of moderate-intensity exercise (any activity that increases your heart rate and causes you to sweat) each week. In addition, most adults need muscle-strengthening exercises on 2 or more days a week.   Maintain a healthy weight. The body mass index (BMI) is a screening tool to identify possible weight problems. It provides an estimate of body fat based on height and weight. Your caregiver can help determine your BMI, and can help you achieve or maintain a healthy weight. For adults 20 years and older:  A BMI below 18.5 is considered underweight.  A BMI of 18.5 to 24.9 is normal.  A BMI of 25 to 29.9 is considered overweight.  A BMI of 30 and above is considered obese.  Maintain normal blood lipids and cholesterol by exercising and minimizing your intake of saturated fat. Eat a balanced diet with plenty of fruits and vegetables. Blood tests for lipids and cholesterol should begin at age 20 and be repeated every 5 years. If your lipid or cholesterol levels are high, you are over 50, or you are a high risk for heart disease, you may need your cholesterol levels checked more frequently.Ongoing high lipid and cholesterol levels should be treated with medicines, if diet and exercise are not effective.  If you smoke, find out from your caregiver how to quit. If you do not use tobacco, do not start.  If you  choose to drink alcohol, do not exceed 2 drinks per day. One drink is considered to be 12 ounces (355 mL) of beer, 5 ounces (148 mL) of wine, or 1.5 ounces (44 mL) of liquor.  Avoid use of street drugs. Do not share needles with anyone. Ask for help if you need support or instructions about stopping the use of drugs.  High blood pressure causes heart disease and increases the risk of stroke. Blood pressure should be checked at least every 1 to 2 years. Ongoing high blood pressure should be treated with medicines if weight loss and exercise are not effective.  If you are 45 to 54 years old, ask your caregiver if you should take aspirin to prevent heart disease.  Diabetes screening involves taking a blood sample to check your fasting blood sugar level. This should be done once every 3 years, after age 45, if you are within normal weight and without risk factors for diabetes. Testing should be considered at a younger age or be carried out more frequently if you are overweight and have at least 1 risk factor for diabetes.  Colorectal cancer can be detected and often prevented. Most routine colorectal cancer screening begins at the age of 50 and continues through age 75. However, your caregiver may recommend screening at an earlier age if you have risk factors for colon cancer. On a yearly basis, your caregiver may provide home test kits to check for hidden blood in the stool. Use of a small camera at the end of a tube,   to directly examine the colon (sigmoidoscopy or colonoscopy), can detect the earliest forms of colorectal cancer. Talk to your caregiver about this at age 50, when routine screening begins. Direct examination of the colon should be repeated every 5 to 10 years through age 75, unless early forms of pre-cancerous polyps or small growths are found.  Hepatitis C blood testing is recommended for all people born from 1945 through 1965 and any individual with known risks for hepatitis C.  Healthy  men should no longer receive prostate-specific antigen (PSA) blood tests as part of routine cancer screening. Consult with your caregiver about prostate cancer screening.  Testicular cancer screening is not recommended for adolescents or adult males who have no symptoms. Screening includes self-exam, caregiver exam, and other screening tests. Consult with your caregiver about any symptoms you have or any concerns you have about testicular cancer.  Practice safe sex. Use condoms and avoid high-risk sexual practices to reduce the spread of sexually transmitted infections (STIs).  Use sunscreen with a sun protection factor (SPF) of 30 or greater. Apply sunscreen liberally and repeatedly throughout the day. You should seek shade when your shadow is shorter than you. Protect yourself by wearing long sleeves, pants, a wide-brimmed hat, and sunglasses year round, whenever you are outdoors.  Notify your caregiver of new moles or changes in moles, especially if there is a change in shape or color. Also notify your caregiver if a mole is larger than the size of a pencil eraser.  A one-time screening for abdominal aortic aneurysm (AAA) and surgical repair of large AAAs by sound wave imaging (ultrasonography) is recommended for ages 65 to 75 years who are current or former smokers.  Stay current with your immunizations. Document Released: 08/23/2007 Document Revised: 05/19/2011 Document Reviewed: 07/22/2010 [employer] Patient Information 2014 [employer].  

## 2012-11-03 NOTE — Assessment & Plan Note (Signed)
Exam done Labs ordered He was referred for a colonscopy Vaccines were reviewed Pt ed material was given

## 2012-11-03 NOTE — Progress Notes (Signed)
Subjective:    Patient ID: Stephen Davila, male    DOB: 12-03-1960, 52 y.o.   MRN: [id number]  HPI  He returns for a complete physical but he also shows me a copy of a CT scan done last year as part of a COPD study at [facility]. The scan revealed calcifications of the LAD and RCA, he has chronic SOB and DOE related to COPD but that has not been worse lately. He does not report any CP, diaphoresis, dizziness, or fatigue.  Review of Systems  Constitutional: Negative.  Negative for fever, chills, diaphoresis, appetite change and fatigue.  HENT: Negative.   Eyes: Negative.   Respiratory: Positive for shortness of breath. Negative for apnea, cough, choking, chest tightness, wheezing and stridor.   Cardiovascular: Negative.  Negative for chest pain, palpitations and leg swelling.  Gastrointestinal: Negative.  Negative for nausea, vomiting, abdominal pain, diarrhea, constipation and blood in stool.  Endocrine: Negative.   Genitourinary: Negative.  Negative for difficulty urinating.  Musculoskeletal: Negative.   Skin: Negative.   Allergic/Immunologic: Negative.   Neurological: Negative.  Negative for dizziness, tremors, syncope, facial asymmetry, weakness, light-headedness and numbness.  Hematological: Negative.  Negative for adenopathy. Does not bruise/bleed easily.  Psychiatric/Behavioral: Negative.        Objective:   Physical Exam  Vitals reviewed. Constitutional: He is oriented to person, place, and time. He appears well-developed and well-nourished.  Non-toxic appearance. He does not have a sickly appearance. He does not appear ill. No distress.  HENT:  Head: Normocephalic and atraumatic.  Mouth/Throat: Oropharynx is clear and moist. No oropharyngeal exudate.  Eyes: Conjunctivae are normal. Right eye exhibits no discharge. Left eye exhibits no discharge. No scleral icterus.  Neck: Normal range of motion. Neck supple. No JVD present. No tracheal deviation present. No  thyromegaly present.  Cardiovascular: Normal rate, regular rhythm, normal heart sounds and intact distal pulses.  Exam reveals no gallop and no friction rub.   No murmur heard. Pulmonary/Chest: Effort normal and breath sounds normal. No stridor. No respiratory distress. He has no wheezes. He has no rales. He exhibits no tenderness.  Abdominal: Soft. Bowel sounds are normal. He exhibits no distension and no mass. There is no tenderness. There is no rebound and no guarding. Hernia confirmed negative in the right inguinal area and confirmed negative in the left inguinal area.  Genitourinary: Rectum normal, prostate normal, testes normal and penis normal. Rectal exam shows no external hemorrhoid, no internal hemorrhoid, no fissure, no mass, no tenderness and anal tone normal. Guaiac negative stool. Prostate is not enlarged and not tender. Right testis shows no mass, no swelling and no tenderness. Right testis is descended. Left testis shows no mass, no swelling and no tenderness. Left testis is descended. Circumcised. No penile erythema or penile tenderness. No discharge found.  Musculoskeletal: Normal range of motion. He exhibits no edema and no tenderness.  Lymphadenopathy:    He has no cervical adenopathy.       Right: No inguinal adenopathy present.       Left: No inguinal adenopathy present.  Neurological: He is oriented to person, place, and time.  Skin: Skin is warm and dry. No rash noted. He is not diaphoretic. No erythema. No pallor.  Psychiatric: He has a normal mood and affect. His behavior is normal. Judgment and thought content normal.     Lab Results  Component Value Date   WBC 9.9 03/06/2012   HGB 16.0 03/06/2012   HCT 50.0 03/06/2012   PLT 280.0  10/29/2011   GLUCOSE 71 10/29/2011   CHOL 230* 10/29/2011   TRIG 207.0* 10/29/2011   HDL 63.50 10/29/2011   LDLDIRECT 136.4 10/29/2011   ALT 30 10/29/2011   AST 24 10/29/2011   NA 139 10/29/2011   K 4.1 10/29/2011   CL 102 10/29/2011    CREATININE 1.1 10/29/2011   BUN 19 10/29/2011   CO2 30 10/29/2011   TSH 1.06 10/29/2011   PSA 0.54 10/29/2011       Assessment & Plan:

## 2012-11-03 NOTE — Telephone Encounter (Signed)
He is coming in today, right?

## 2012-11-03 NOTE — Assessment & Plan Note (Signed)
Check the FLP today I have asked him to take crestor

## 2012-11-04 LAB — COMPREHENSIVE METABOLIC PANEL
AST: 20 U/L (ref 0–37)
Alkaline Phosphatase: 54 U/L (ref 39–117)
BUN: 17 mg/dL (ref 6–23)
Calcium: 8.9 mg/dL (ref 8.4–10.5)
Chloride: 107 mEq/L (ref 96–112)
Creatinine, Ser: 1.1 mg/dL (ref 0.4–1.5)

## 2012-11-04 LAB — PSA: PSA: 0.56 ng/mL (ref 0.10–4.00)

## 2012-11-04 LAB — LIPID PANEL: HDL: 56.9 mg/dL (ref >39.00)

## 2012-11-04 LAB — TSH: TSH: 0.86 u[IU]/mL (ref 0.35–5.50)

## 2012-11-05 ENCOUNTER — Encounter: Payer: Self-pay | Admitting: Internal Medicine

## 2012-11-09 ENCOUNTER — Telehealth: Payer: Self-pay

## 2012-11-09 NOTE — Telephone Encounter (Signed)
Received pharmacy rejection stating that insurance will not cover Crestor 10 mg  without a prior authorization. PA initiated via covermymed, approval pending

## 2012-11-15 ENCOUNTER — Encounter: Payer: Self-pay | Admitting: Gastroenterology

## 2012-11-16 ENCOUNTER — Telehealth: Payer: Self-pay

## 2012-11-16 DIAGNOSIS — E785 Hyperlipidemia, unspecified: Secondary | ICD-10-CM

## 2012-11-16 MED ORDER — PRAVASTATIN SODIUM 40 MG PO TABS: 40.0000 mg | ORAL_TABLET | Freq: Every day | ORAL | Status: DC

## 2012-11-16 NOTE — Telephone Encounter (Signed)
Received faxed denial from insurance stating that Crestor denied, must try lovastatin, pravastatin, simvastatin, or atorvastatin.

## 2012-11-17 ENCOUNTER — Encounter: Payer: Self-pay | Admitting: Pulmonary Disease

## 2012-11-17 ENCOUNTER — Ambulatory Visit (INDEPENDENT_AMBULATORY_CARE_PROVIDER_SITE_OTHER): Payer: BC Managed Care – PPO | Admitting: Pulmonary Disease

## 2012-11-17 ENCOUNTER — Telehealth: Payer: Self-pay | Admitting: Internal Medicine

## 2012-11-17 VITALS — BP 122/80 | HR 69 | Temp 98.8°F | Ht 71.0 in | Wt 163.6 lb

## 2012-11-17 DIAGNOSIS — J449 Chronic obstructive pulmonary disease, unspecified: Secondary | ICD-10-CM

## 2012-11-17 DIAGNOSIS — J4489 Other specified chronic obstructive pulmonary disease: Secondary | ICD-10-CM

## 2012-11-17 MED ORDER — PREDNISONE 10 MG PO TABS: 10.0000 mg | ORAL_TABLET | Freq: Every day | ORAL | Status: DC

## 2012-11-17 MED ORDER — ALBUTEROL SULFATE HFA 108 (90 BASE) MCG/ACT IN AERS: 2.0000 | INHALATION_SPRAY | Freq: Four times a day (QID) | RESPIRATORY_TRACT | Status: DC | PRN

## 2012-11-17 NOTE — Telephone Encounter (Signed)
Called spoke with patient's spouse.  Pt has been having increased SOB and chest tightness x4 days.  Pt denies any wheezing, cough, congestion, f/c/s, n/v.  Requesting appt today.  Last ov 5.23.12 with MW, whom is not in the office today.  TP is off.  Appt scheduled with RA this morning at 9:45am.  Nothing further needed at this time; will sign off.

## 2012-11-17 NOTE — Patient Instructions (Addendum)
Albuterol MDI sample- 2 puffs q 6h as needed Prednisone 10 mg x 7ds Take pepcid daily x 2 weeks Call us if symptoms worse or no better & we may pursue more testing

## 2012-11-17 NOTE — Progress Notes (Signed)
  Subjective:    Patient ID: Stephen Davila, male    DOB: Apr 01, 1960, 52 y.o.   MRN: [id number]  HPI  67 yowm quit smoking April 2011 with Moderate COPD by PFT's Jul 18, 2009  07/2004 pulm eval for severe cough    08/01/2010 ov/ Wert satisfied with symbicort, occ misses the pm dose and notes occ am cough/congestion the next day only. Not limited by sob.  Past Medical History:  COPD  - Alpha 1 genotype sent June 20, 2009 >> MM  - PFT's Jul 18, 2009 FEV1 2.70 (76) ratio 55 and no response to BD p am symbicort and DLC0 75%   11/17/2012 Pt has been having increased SOB and chest tightness x4 days. Pt denies any wheezing, cough, congestion, f/c/s, n/v. Complains of SOB x3 days. Denies tightness in chest, wheezing or f/c/s. Started crestor 10 mg 11/09/12 and stopped 11/13/12 due to unsure if causing SOB - no tongue swelling or stridor or wheezing He had neck surgery by Dr Danielle Dess 7 wks ago with good results - tingling /numbness in LUE has subsided  Review of Systems neg for any significant sore throat, dysphagia, itching, sneezing, nasal congestion or excess/ purulent secretions, fever, chills, sweats, unintended wt loss, pleuritic or exertional cp, hempoptysis, orthopnea pnd or change in chronic leg swelling. Also denies presyncope, palpitations, heartburn, abdominal pain, nausea, vomiting, diarrhea or change in bowel or urinary habits, dysuria,hematuria, rash, arthralgias, visual complaints, headache, numbness weakness or ataxia.     Objective:   Physical Exam  Gen. Pleasant, well-nourished, in no distress ENT - no lesions, no post nasal drip Neck: No JVD, no thyromegaly, no carotid bruits Lungs: no use of accessory muscles, no dullness to percussion, clear without rales or rhonchi  Cardiovascular: Rhythm regular, heart sounds  normal, no murmurs or gallops, no peripheral edema Musculoskeletal: No deformities, no cyanosis or clubbing        Assessment & Plan:

## 2012-11-19 NOTE — Assessment & Plan Note (Signed)
Cause for increased dyspnea is not clear on today's evaluation. He is far enough out of neck surgery , not to have concern about upper airway issues. Not clear that he is having a COPD flare  Albuterol MDI sample- 2 puffs q 6h as needed Prednisone 10 mg x 7ds Take pepcid daily x 2 weeks Call us if symptoms worse or no better & we may pursue more testing

## 2012-11-24 ENCOUNTER — Ambulatory Visit (INDEPENDENT_AMBULATORY_CARE_PROVIDER_SITE_OTHER): Payer: BC Managed Care – PPO | Admitting: Physician Assistant

## 2012-11-24 DIAGNOSIS — I2510 Atherosclerotic heart disease of native coronary artery without angina pectoris: Secondary | ICD-10-CM

## 2012-11-24 NOTE — Progress Notes (Signed)
Exercise Treadmill Test  Pre-Exercise Testing Evaluation Rhythm: normal sinus  Rate: 66     Test  Exercise Tolerance Test Ordering MD: Cassell Clement, MD  Interpreting MD: Jacolyn Reedy, PA-C  Unique Test No: 1  Treadmill:  1  Indication for ETT: known ASHD  Contraindication to ETT: No   Stress Modality: exercise - treadmill  Cardiac Imaging Performed: non   Protocol: standard Bruce - maximal  Max BP:  226/86  Max MPHR (bpm):  166 85% MPR (bpm):  141  MPHR obtained (bpm):  226(recovery SVT) % MPHR obtained:  136  Reached 85% MPHR (min:sec):  4:44 Total Exercise Time (min-sec):  9:18  Workload in METS:  10.5 Borg Scale: 15  Reason ETT Terminated:  fatigue    ST Segment Analysis At Rest: normal ST segments - no evidence of significant ST depression With Exercise: borderline ST changes  Other Information Arrhythmia:  Yes Angina during ETT:  absent (0) Quality of ETT:  indeterminate  ETT Interpretation:  Pt had upsloping ST depression with exercise, frequent PAC's, occasional PVC. Went into rapid SVT 226 in recovery but converted quickly on his own. Patient felt a little something, but not too bothered by it. Says he has it on rare occasions and has never been evaluated for it because it doesn't really bother him. No chest pain or shortness of breath.  Comments: See above  Recommendations: Reviewed GXT with Dr. Patty Sermons who agrees with normal stress test. Infrequent SVT according to patient so he can follow up with Dr. Yetta Barre.

## 2012-11-29 ENCOUNTER — Encounter: Payer: Self-pay | Admitting: Internal Medicine

## 2012-11-29 ENCOUNTER — Ambulatory Visit (INDEPENDENT_AMBULATORY_CARE_PROVIDER_SITE_OTHER): Payer: BC Managed Care – PPO | Admitting: Internal Medicine

## 2012-11-29 ENCOUNTER — Ambulatory Visit (INDEPENDENT_AMBULATORY_CARE_PROVIDER_SITE_OTHER)
Admission: RE | Admit: 2012-11-29 | Discharge: 2012-11-29 | Disposition: A | Discharge status: Home / Self Care | Payer: BC Managed Care – PPO | Source: Ambulatory Visit | Attending: Internal Medicine | Admitting: Internal Medicine

## 2012-11-29 VITALS — BP 152/80 | HR 85 | Temp 97.3°F

## 2012-11-29 DIAGNOSIS — R0609 Other forms of dyspnea: Secondary | ICD-10-CM

## 2012-11-29 DIAGNOSIS — J449 Chronic obstructive pulmonary disease, unspecified: Secondary | ICD-10-CM

## 2012-11-29 DIAGNOSIS — J4489 Other specified chronic obstructive pulmonary disease: Secondary | ICD-10-CM

## 2012-11-29 DIAGNOSIS — R0600 Dyspnea, unspecified: Secondary | ICD-10-CM

## 2012-11-29 DIAGNOSIS — R0989 Other specified symptoms and signs involving the circulatory and respiratory systems: Secondary | ICD-10-CM

## 2012-11-29 MED ORDER — FAMOTIDINE 20 MG PO TABS: ORAL_TABLET | ORAL | Status: DC

## 2012-11-29 MED ORDER — MOMETASONE FURO-FORMOTEROL FUM 100-5 MCG/ACT IN AERO: INHALATION_SPRAY | RESPIRATORY_TRACT | Status: DC

## 2012-11-29 MED ORDER — PANTOPRAZOLE SODIUM 40 MG PO TBEC: 40.0000 mg | DELAYED_RELEASE_TABLET | Freq: Every day | ORAL | Status: DC

## 2012-11-29 NOTE — Patient Instructions (Addendum)
Pantoprazole (protonix) 40 mg   Take 30-60 min before first meal of the day and Pepcid 20 mg one bedtime until return to office - this is the best way to tell whether stomach acid is contributing to your problem.    Please remember to go to the  x-ray department downstairs for your tests - we will call you with the results when they are available.    The next step if not improving is a CT of your neck > call us or Dr Danielle Dess to schedule  GERD (REFLUX)  is an extremely common cause of respiratory symptoms, many times with no significant heartburn at all.    It can be treated with medication, but also with lifestyle changes including avoidance of late meals, excessive alcohol, smoking cessation, and avoid fatty foods, chocolate, peppermint, colas, red wine, and acidic juices such as orange juice.  NO MINT OR MENTHOL PRODUCTS SO NO COUGH DROPS  USE SUGARLESS CANDY INSTEAD (jolley ranchers or Stover's)  NO OIL BASED VITAMINS - use powdered substitutes.

## 2012-11-29 NOTE — Progress Notes (Signed)
Quick Note:  Spoke with pt and notified of results per Dr. Wert. Pt verbalized understanding and denied any questions.  ______ 

## 2012-11-29 NOTE — Progress Notes (Signed)
Subjective:    Patient ID: Stephen Davila, male    DOB: June 24, 1960    MRN: [id number]    Brief patient profile:  52 yowm quit smoking April 2011 with Moderate COPD by PFT's Jul 18, 2009  07/2004 pulm eval for severe cough      History of Present Illness  11/17/2012 NP Pt has been having increased SOB and chest tightness x4 days. Pt denies any wheezing, cough, congestion, f/c/s, n/v. Complains of SOB x3 days. Denies tightness in chest, wheezing or f/c/s. Started crestor 10 mg 11/09/12 and stopped 11/13/12 due to unsure if causing SOB - no tongue swelling or stridor or wheezing He had neck surgery by Dr Danielle Dess 10/01/12 Cx 5-7 and difficulty swallowing rec Albuterol MDI sample- 2 puffs q 6h as needed Prednisone 10 mg x 7ds Take pepcid daily x 2 weeks   11/29/2012 f/u ov/Alleya Demeter re:sob p neck sugergy no better p rx as above Chief Complaint  Patient presents with  . Acute Visit    Pt c/o "tightness in throat" x 2 wks. He also c/o minimal cough, non prod. He has been using albuterol inhaler 2-3 times daily    Not really better with saba, not worse with exertion, fullness in throat 24/7 but not worse with exertion. No assoc  neck pain or choking on food.   No obvious day to day or daytime variabilty or assoc chronic cough or cp or chest tightness, subjective wheeze overt sinus or hb symptoms. No unusual exp hx or h/o childhood pna/ asthma or knowledge of premature birth.  Sleeping ok without nocturnal  or early am exacerbation  of respiratory  c/o's or need for noct saba. Also denies any obvious fluctuation of symptoms with weather or environmental changes or other aggravating or alleviating factors except as outlined above   Current Medications, Allergies, Complete Past Medical History, Past Surgical History, Family History, and Social History were reviewed in [employer] record.  ROS  The following are not active complaints unless bolded sore throat, dysphagia,  dental problems, itching, sneezing,  nasal congestion or excess/ purulent secretions, ear ache,   fever, chills, sweats, unintended wt loss, pleuritic or exertional cp, hemoptysis,  orthopnea pnd or leg swelling, presyncope, palpitations, heartburn, abdominal pain, anorexia, nausea, vomiting, diarrhea  or change in bowel or urinary habits, change in stools or urine, dysuria,hematuria,  rash, arthralgias, visual complaints, headache, numbness weakness or ataxia or problems with walking or coordination,  change in mood/affect or memory.         Past Medical History:  COPD  - Alpha 1 genotype sent June 20, 2009 >> MM  - PFT's Jul 18, 2009 FEV1 2.70 (76) ratio 55 and no response to BD p am symbicort and DLC0 75%        Objective:   Physical Exam  Wt Readings from Last 3 Encounters:  11/17/12 163 lb 9.6 oz (74.208 kg)  11/03/12 164 lb 1.9 oz (74.444 kg)  09/18/12 171 lb (77.565 kg)     Audible wheeze s stethoscope  HEENT: nl dentition, turbinates, and orophanx. Nl external ear canals without cough reflex   NECK :  without JVD/Nodes/TM/ nl carotid upstrokes bilaterally   LUNGS: no acc muscle use, clear to A and P bilaterally without cough on insp or exp maneuvers   CV:  RRR  no s3 or murmur or increase in P2, no edema   ABD:  soft and nontender with nl excursion in the supine position. No bruits or  organomegaly, bowel sounds nl  MS:  warm without deformities, calf tenderness, cyanosis or clubbing  SKIN: warm and dry without lesions    NEURO:  alert, approp, no deficits      CXR  11/29/2012 :  No active disease. Hyperinflation again noted.      Assessment & Plan:

## 2012-11-30 DIAGNOSIS — R0600 Dyspnea, unspecified: Secondary | ICD-10-CM | POA: Insufficient documentation

## 2012-11-30 NOTE — Assessment & Plan Note (Signed)
-   Alpha 1 genotype sent June 20, 2009 >> MM - PFT's Jul 18, 2009 FEV1 2.70 (76) ratio 55 and no response to B2 p am symbicort and DLC0 75%  C/w GOLD II  - Spirometry 11/29/2012 FEV1  3.22 (81%) ratio 68   sympotms not c/w aecopd, much more likely upper airway

## 2012-11-30 NOTE — Assessment & Plan Note (Signed)
Symptoms are markedly disproportionate to objective findings and not clear this is a lung problem but pt does appear to have difficult airway management issues. DDX of  difficult airways managment all start with A and  include Adherence, Ace Inhibitors, Acid Reflux, Active Sinus Disease, Alpha 1 Antitripsin deficiency, Anxiety masquerading as Airways dz,  ABPA,  allergy(esp in young), Aspiration (esp in elderly), Adverse effects of DPI,  Active smokers, plus two Bs  = Bronchiectasis and Beta blocker use..and one C= CHF  ? Acid (or non-acid) GERD > always difficult to exclude as up to 75% of pts in some series report no assoc GI/ Heartburn symptoms> rec max (24h)  acid suppression and diet restrictions/ reviewed and instructions given in writting   ? Swelling related to neck surgery > ? Needs neck CT  ? Anxiety > dx of exclusion

## 2012-12-14 ENCOUNTER — Telehealth: Payer: Self-pay | Admitting: Internal Medicine

## 2012-12-14 MED ORDER — BUDESONIDE-FORMOTEROL FUMARATE 80-4.5 MCG/ACT IN AERO: 2.0000 | INHALATION_SPRAY | Freq: Two times a day (BID) | RESPIRATORY_TRACT | Status: DC

## 2012-12-14 MED ORDER — ALBUTEROL SULFATE HFA 108 (90 BASE) MCG/ACT IN AERS: 2.0000 | INHALATION_SPRAY | Freq: Four times a day (QID) | RESPIRATORY_TRACT | Status: DC | PRN

## 2012-12-14 NOTE — Telephone Encounter (Signed)
Fine to use symbicort 80 and give year's supply and ventolin prn but only give 2 refills on this

## 2012-12-14 NOTE — Telephone Encounter (Signed)
Called, spoke with pt.  He is requesting rx for SYMBICORT 80 and ventolin hfa - [facility].  Dr. Sherene Sires, I do not see where Symbicort 80 has ever been on pt's meds list.  I asked pt about this and he stated the time before last he was given symbicort 80 and last time he was given dulera 100 and was told we would send rxs for which ever one he liked best.  Pt states he likes the symbicort better than the dulera and is sure it is the 80 strength.  Dr. Sherene Sires, pls advise if Symbicort 80 rx is ok as it hasn't been on his med list.  Thank you.  Ventolin rx has already been sent in.

## 2012-12-14 NOTE — Telephone Encounter (Signed)
I spoke with pt and is aware RX's have been sent. Nothing further needed

## 2012-12-15 ENCOUNTER — Ambulatory Visit: Payer: BC Managed Care – PPO | Admitting: Internal Medicine

## 2012-12-15 ENCOUNTER — Ambulatory Visit (INDEPENDENT_AMBULATORY_CARE_PROVIDER_SITE_OTHER): Payer: BC Managed Care – PPO | Admitting: Internal Medicine

## 2012-12-15 ENCOUNTER — Encounter: Payer: Self-pay | Admitting: Internal Medicine

## 2012-12-15 VITALS — BP 128/74 | HR 63 | Temp 97.9°F | Ht 71.0 in | Wt 166.6 lb

## 2012-12-15 DIAGNOSIS — R059 Cough, unspecified: Secondary | ICD-10-CM

## 2012-12-15 DIAGNOSIS — J449 Chronic obstructive pulmonary disease, unspecified: Secondary | ICD-10-CM

## 2012-12-15 DIAGNOSIS — R05 Cough: Secondary | ICD-10-CM

## 2012-12-15 DIAGNOSIS — J4489 Other specified chronic obstructive pulmonary disease: Secondary | ICD-10-CM

## 2012-12-15 NOTE — Progress Notes (Signed)
Subjective:    Patient ID: Stephen Davila, male    DOB: September 12, 1960    MRN: [id number]    Brief patient profile:  52 yowm quit smoking April 2011 with Moderate COPD by PFT's Jul 18, 2009  07/2004 pulm eval for severe cough   GOLD I copd by spirometry 11/29/12     History of Present Illness  11/17/2012 NP Pt has been having increased SOB and chest tightness x4 days. Pt denies any wheezing, cough, congestion, f/c/s, n/v. Complains of SOB x3 days. Denies tightness in chest, wheezing or f/c/s. Started crestor 10 mg 11/09/12 and stopped 11/13/12 due to unsure if causing SOB - no tongue swelling or stridor or wheezing He had neck surgery by Dr Stephen Davila 10/01/12 Cx 5-7 and difficulty swallowing rec Albuterol MDI sample- 2 puffs q 6h as needed Prednisone 10 mg x 7ds Take pepcid daily x 2 weeks   11/29/2012 f/u ov/Stephen Davila re:sob p neck sugergy no better p rx as above Chief Complaint  Patient presents with  . Acute Visit    Pt c/o "tightness in throat" x 2 wks. He also c/o minimal cough, non prod. He has been using albuterol inhaler 2-3 times daily   Not really better with saba, not worse with exertion, fullness in throat 24/7 but not worse with exertion. No assoc  neck pain or choking on food.  rec Pantoprazole (protonix) 40 mg   Take 30-60 min before first meal of the day and Pepcid 20 mg one bedtime until return to office - this is the best way to tell whether stomach acid is contributing to your problem.   Please remember to go to the  x-ray department downstairs for your tests - we will call you with the results when they are available. The next step if not improving is a CT of your neck > call us or Dr Stephen Davila to schedule GERD diet   12/15/2012 f/u ov/Stephen Davila re: GOLD I copd  Chief Complaint  Patient presents with  . Follow-up    Pt states he is improving, minimal cough and throat clearing. He has used rescue inhaler twice in the past wk.   still feels urge to clear throat esp p talking but  not p sleeping  > white mucus, wakes up nasal congestion problem is year round x decade  Breathing is not limiting at all     No obvious day to day or daytime variabilty or assoc cp or chest tightness, subjective wheeze overt sinus or hb symptoms. No unusual exp hx or h/o childhood pna/ asthma or knowledge of premature birth.  Sleeping ok without nocturnal  or early am exacerbation  of respiratory  c/o's or need for noct saba. Also denies any obvious fluctuation of symptoms with weather or environmental changes or other aggravating or alleviating factors except as outlined above   Current Medications, Allergies, Complete Past Medical History, Past Surgical History, Family History, and Social History were reviewed in [employer] record.  ROS  The following are not active complaints unless bolded sore throat, dysphagia, dental problems, itching, sneezing,  nasal congestion or excess/ purulent secretions, ear ache,   fever, chills, sweats, unintended wt loss, pleuritic or exertional cp, hemoptysis,  orthopnea pnd or leg swelling, presyncope, palpitations, heartburn, abdominal pain, anorexia, nausea, vomiting, diarrhea  or change in bowel or urinary habits, change in stools or urine, dysuria,hematuria,  rash, arthralgias, visual complaints, headache, numbness weakness or ataxia or problems with walking or coordination,  change in mood/affect  or memory.         Past Medical History:  COPD  - Alpha 1 genotype sent June 20, 2009 >> MM  - PFT's Jul 18, 2009 FEV1 2.70 (76) ratio 55 and no response to BD p am symbicort and DLC0 75%        Objective:   Physical Exam  12/15/2012     167  Wt Readings from Last 3 Encounters:  11/17/12 163 lb 9.6 oz (74.208 kg)  11/03/12 164 lb 1.9 oz (74.444 kg)  09/18/12 171 lb (77.565 kg)   amb wm no longer with audible wheezing    HEENT: nl dentition, turbinates, and orophanx. Nl external ear canals without cough reflex   NECK :   without JVD/Nodes/TM/ nl carotid upstrokes bilaterally   LUNGS: no acc muscle use, clear to A and P bilaterally without cough on insp or exp maneuvers   CV:  RRR  no s3 or murmur or increase in P2, no edema   ABD:  soft and nontender with nl excursion in the supine position. No bruits or organomegaly, bowel sounds nl  MS:  warm without deformities, calf tenderness, cyanosis or clubbing       CXR  11/29/2012 :  No active disease. Hyperinflation again noted.      Assessment & Plan:

## 2012-12-15 NOTE — Patient Instructions (Addendum)
Symbicort 80 (or dulera 100)Take 2 puffs first thing in am and then another 2 puffs about 12 hours later only you are having trouble with your breathing  For the throat rec stay on acid suppression and diet.  GERD (REFLUX)  is an extremely common cause of respiratory symptoms, many times with no significant heartburn at all.    It can be treated with medication, but also with lifestyle changes including avoidance of late meals, excessive alcohol, smoking cessation, and avoid fatty foods, chocolate, peppermint, colas, red wine, and acidic juices such as orange juice.  NO MINT OR MENTHOL PRODUCTS SO NO COUGH DROPS  USE SUGARLESS CANDY INSTEAD (jolley ranchers or Stover's)  NO OIL BASED VITAMINS - use powdered substitutes.   Next step if not satisfied is sinus CT >  Call [facility] at [phone] if not better

## 2012-12-17 DIAGNOSIS — R05 Cough: Secondary | ICD-10-CM | POA: Insufficient documentation

## 2012-12-17 DIAGNOSIS — R059 Cough, unspecified: Secondary | ICD-10-CM | POA: Insufficient documentation

## 2012-12-17 NOTE — Assessment & Plan Note (Signed)
-   Alpha 1 genotype sent June 20, 2009 >> MM - PFT's Jul 18, 2009 FEV1 2.70 (76) ratio 55 and no response to B2 p am symbicort and DLC0 75%  C/w GOLD II  - Spirometry 11/29/2012 FEV1  3.22 (81%) ratio 68 s truncation on exp f/v  Not sure he even needs any inhalers at this point as they can all potentially trigger upper airway cough syndrome so ok to taper off symbicort and see what if any symptoms occur

## 2012-12-17 NOTE — Assessment & Plan Note (Addendum)
In retrospect well prior to his neck surgery he's developed  Classic Upper airway cough syndrome, so named because it's frequently impossible to sort out how much is  CR/sinusitis with freq throat clearing (which can be related to primary GERD)   vs  causing  secondary (" extra esophageal")  GERD from wide swings in gastric pressure that occur with throat clearing, often  promoting self use of mint and menthol lozenges that reduce the lower esophageal sphincter tone and exacerbate the problem further in a cyclical fashion.   These are the same pts (now being labeled as having "irritable larynx syndrome" by some cough centers) who not infrequently have a history of having failed to tolerate ace inhibitors,  dry powder inhalers or biphosphonates or report having atypical reflux symptoms that don't respond to standard doses of PPI , tolerate ET's poorly as may well have been the case here,  and are easily confused as having aecopd or asthma flares by even experienced allergists/ pulmonologists.  Next step in w/u is sinus ct/ possible ent eval

## 2013-01-17 ENCOUNTER — Ambulatory Visit (AMBULATORY_SURGERY_CENTER): Payer: Self-pay

## 2013-01-17 ENCOUNTER — Encounter: Payer: Self-pay | Admitting: Gastroenterology

## 2013-01-17 VITALS — Ht 71.0 in | Wt 163.0 lb

## 2013-01-17 DIAGNOSIS — Z1211 Encounter for screening for malignant neoplasm of colon: Secondary | ICD-10-CM

## 2013-01-17 MED ORDER — SUPREP BOWEL PREP KIT 17.5-3.13-1.6 GM/177ML PO SOLN: 1.0000 | Freq: Once | ORAL | Status: DC

## 2013-01-28 ENCOUNTER — Telehealth: Payer: Self-pay

## 2013-01-28 NOTE — Telephone Encounter (Signed)
Patient informed of MD instructions. 

## 2013-01-28 NOTE — Telephone Encounter (Signed)
Spoke with patient and advised MD out of the office until 01/31/13 and offered appt with someone else. No appt availability today and pt unable to come in Monday due to scheduled colonoscopy. I advised that I would send message to one of the partner to advise just how soon pt needs to be seen, pt states that he feels fine. Please advise

## 2013-01-28 NOTE — Telephone Encounter (Signed)
Meagan called stating that their office has been seeing pt for a research study and recently had an abnormal lab result. She reports a platelet count of 137 which is down form last year. She advised that she would fax over lab result as well as contact pt to schedule a f/u appointment for PCP to address.

## 2013-01-28 NOTE — Telephone Encounter (Signed)
This is generally not serious or signficant;  If there is no bleeding or bruising and plt are over 50 (137 is over 50) there should be no difficulty with completing the colonscopy as planned.  O/w ok to see Dr Yetta Barre in f/u at pt convenience

## 2013-01-31 ENCOUNTER — Ambulatory Visit (AMBULATORY_SURGERY_CENTER): Payer: BC Managed Care – PPO | Admitting: Gastroenterology

## 2013-01-31 ENCOUNTER — Encounter: Payer: Self-pay | Admitting: Gastroenterology

## 2013-01-31 VITALS — BP 115/69 | HR 70 | Temp 98.2°F | Resp 15 | Ht 71.0 in | Wt 163.0 lb

## 2013-01-31 DIAGNOSIS — Z1211 Encounter for screening for malignant neoplasm of colon: Secondary | ICD-10-CM

## 2013-01-31 MED ORDER — SODIUM CHLORIDE 0.9 % IV SOLN: 500.0000 mL | INTRAVENOUS | Status: DC

## 2013-01-31 NOTE — Progress Notes (Signed)
  [facility] Anesthesia Post-op Note  Patient: Stephen Davila  Procedure(s) Performed: colonoscopy  Patient Location: [facility] - Recovery Area  Anesthesia Type: Deep Sedation/Propofol  Level of Consciousness: awake, oriented and patient cooperative  Airway and Oxygen Therapy: Patient Spontanous Breathing  Post-op Pain: none  Post-op Assessment:  Post-op Vital signs reviewed, Patient's Cardiovascular Status Stable, Respiratory Function Stable, Patent Airway, No signs of Nausea or vomiting and Pain level controlled  Post-op Vital Signs: Reviewed and stable  Complications: No apparent anesthesia complications  Annya Lizana E 11:09 AM

## 2013-01-31 NOTE — Patient Instructions (Signed)
Diverticulosis seen today, try to follow high fiber diet. See handouts given. Resume current medications. Repeat colonoscopy in 10 years. Call us with any questions or concerns. Thank you!!  YOU HAD AN ENDOSCOPIC PROCEDURE TODAY AT THE  ENDOSCOPY CENTER: Refer to the procedure report that was given to you for any specific questions about what was found during the examination.  If the procedure report does not answer your questions, please call your gastroenterologist to clarify.  If you requested that your care partner not be given the details of your procedure findings, then the procedure report has been included in a sealed envelope for you to review at your convenience later.  YOU SHOULD EXPECT: Some feelings of bloating in the abdomen. Passage of more gas than usual.  Walking can help get rid of the air that was put into your GI tract during the procedure and reduce the bloating. If you had a lower endoscopy (such as a colonoscopy or flexible sigmoidoscopy) you may notice spotting of blood in your stool or on the toilet paper. If you underwent a bowel prep for your procedure, then you may not have a normal bowel movement for a few days.  DIET: Your first meal following the procedure should be a light meal and then it is ok to progress to your normal diet.  A half-sandwich or bowl of soup is an example of a good first meal.  Heavy or fried foods are harder to digest and may make you feel nauseous or bloated.  Likewise meals heavy in dairy and vegetables can cause extra gas to form and this can also increase the bloating.  Drink plenty of fluids but you should avoid alcoholic beverages for 24 hours.  ACTIVITY: Your care partner should take you home directly after the procedure.  You should plan to take it easy, moving slowly for the rest of the day.  You can resume normal activity the day after the procedure however you should NOT DRIVE or use heavy machinery for 24 hours (because of the sedation  medicines used during the test).    SYMPTOMS TO REPORT IMMEDIATELY: A gastroenterologist can be reached at any hour.  During normal business hours, 8:30 AM to 5:00 PM Monday through Friday, call [phone].  After hours and on weekends, please call the GI answering service at [phone] who will take a message and have the physician on call contact you.   Following lower endoscopy (colonoscopy or flexible sigmoidoscopy):  Excessive amounts of blood in the stool  Significant tenderness or worsening of abdominal pains  Swelling of the abdomen that is new, acute  Fever of 100F or higher  Following upper endoscopy (EGD)  Vomiting of blood or coffee ground material  New chest pain or pain under the shoulder blades  Painful or persistently difficult swallowing  New shortness of breath  Fever of 100F or higher  Black, tarry-looking stools  FOLLOW UP: If any biopsies were taken you will be contacted by phone or by letter within the next 1-3 weeks.  Call your gastroenterologist if you have not heard about the biopsies in 3 weeks.  Our staff will call the home number listed on your records the next business day following your procedure to check on you and address any questions or concerns that you may have at that time regarding the information given to you following your procedure. This is a courtesy call and so if there is no answer at the home number and we have  not heard from you through the emergency physician on call, we will assume that you have returned to your regular daily activities without incident.  SIGNATURES/CONFIDENTIALITY: You and/or your care partner have signed paperwork which will be entered into your electronic medical record.  These signatures attest to the fact that that the information above on your After Visit Summary has been reviewed and is understood.  Full responsibility of the confidentiality of this discharge information lies with you and/or your  care-partner.

## 2013-01-31 NOTE — Op Note (Addendum)
[facility] [address] [place], [address]   COLONOSCOPY PROCEDURE REPORT  PATIENT: Stephen, Davila  MR#: [id number] BIRTHDATE: 11/19/1960 , 52  yrs. old GENDER: Male ENDOSCOPIST: Louis Meckel, MD REFERRED [id number] Karsten Ro, M.D. PROCEDURE DATE:  01/31/2013 PROCEDURE:   Colonoscopy, diagnostic First Screening Colonoscopy - Avg.  risk and is 50 yrs.  old or older Yes.  Prior Negative Screening - Now for repeat screening. N/A  History of Adenoma - Now for follow-up colonoscopy & has been > or = to 3 yrs.  N/A  Polyps Removed Today? No.  Recommend repeat exam, <10 yrs? No. ASA CLASS:   Class II INDICATIONS:average risk screening. MEDICATIONS: propofol (Diprivan) 200mg  IV  DESCRIPTION OF PROCEDURE:   After the risks benefits and alternatives of the procedure were thoroughly explained, informed consent was obtained.  A digital rectal exam revealed no abnormalities of the rectum.   The [id number]  endoscope was introduced through the anus and advanced to the cecum, which was identified by both the appendix and ileocecal valve. No adverse events experienced.   The quality of the prep was excellent using Suprep  The instrument was then slowly withdrawn as the colon was fully examined.      COLON FINDINGS: Moderate diverticulosis was noted at the cecum and in the ascending colon.   The colon was otherwise normal.  There was no diverticulosis, inflammation, polyps or cancers unless previously stated.  Retroflexed views revealed no abnormalities. The time to cecum=1 minutes 0 seconds.  Withdrawal time=7 minutes 54 seconds.  The scope was withdrawn and the procedure completed. COMPLICATIONS: There were no complications.  ENDOSCOPIC IMPRESSION: 1.   Moderate diverticulosis was noted at the cecum and in the ascending colon 2.   The colon was otherwise normal  RECOMMENDATIONS: Continue current colorectal screening recommendations for  "routine risk" patients with a repeat colonoscopy in 10 years.   eSigned:  Louis Meckel, MD 01/31/2013 11:33 AM Revised: 01/31/2013 11:33 AM  cc:   PATIENT NAME:  Stephen, Davila MR#: [id number]

## 2013-01-31 NOTE — Progress Notes (Signed)
Patient did not experience any of the following events: a burn prior to discharge; a fall within the facility; wrong site/side/patient/procedure/implant event; or a hospital transfer or hospital admission upon discharge from the facility. (G8907) Patient did not have preoperative order for IV antibiotic SSI prophylaxis. (G8918)  

## 2013-02-01 ENCOUNTER — Telehealth: Payer: Self-pay | Admitting: *Deleted

## 2013-02-01 NOTE — Telephone Encounter (Signed)
  Follow up Call-  Call back number 01/31/2013  Post procedure Call Back phone  # [phone]  Permission to leave phone message Yes    [facility]

## 2013-02-07 ENCOUNTER — Ambulatory Visit (INDEPENDENT_AMBULATORY_CARE_PROVIDER_SITE_OTHER): Payer: BC Managed Care – PPO | Admitting: Internal Medicine

## 2013-02-07 ENCOUNTER — Other Ambulatory Visit (INDEPENDENT_AMBULATORY_CARE_PROVIDER_SITE_OTHER): Payer: BC Managed Care – PPO

## 2013-02-07 ENCOUNTER — Encounter: Payer: Self-pay | Admitting: Internal Medicine

## 2013-02-07 VITALS — BP 130/72 | HR 72 | Temp 98.7°F | Resp 16 | Ht 71.0 in | Wt 166.0 lb

## 2013-02-07 DIAGNOSIS — I2510 Atherosclerotic heart disease of native coronary artery without angina pectoris: Secondary | ICD-10-CM

## 2013-02-07 DIAGNOSIS — D696 Thrombocytopenia, unspecified: Secondary | ICD-10-CM

## 2013-02-07 DIAGNOSIS — E785 Hyperlipidemia, unspecified: Secondary | ICD-10-CM

## 2013-02-07 LAB — CBC WITH DIFFERENTIAL/PLATELET
Basophils Absolute: 0.1 10*3/uL (ref 0.0–0.1)
Basophils Relative: 0.6 % (ref 0.0–3.0)
Eosinophils Relative: 3.7 % (ref 0.0–5.0)
HCT: 44.4 % (ref 39.0–52.0)
Hemoglobin: 15.4 g/dL (ref 13.0–17.0)
Lymphs Abs: 2.6 10*3/uL (ref 0.7–4.0)
MCV: 95 fl (ref 78.0–100.0)
Monocytes Absolute: 1 10*3/uL (ref 0.1–1.0)
Monocytes Relative: 9.5 % (ref 3.0–12.0)
RBC: 4.67 Mil/uL (ref 4.22–5.81)
WBC: 10.2 10*3/uL (ref 4.5–10.5)

## 2013-02-07 MED ORDER — PITAVASTATIN CALCIUM 2 MG PO TABS: 1.0000 | ORAL_TABLET | Freq: Every day | ORAL | Status: DC

## 2013-02-07 NOTE — Progress Notes (Signed)
Subjective:    Patient ID: Stephen Davila, male    DOB: 12/18/1960, 52 y.o.   MRN: [id number]  HPI Comments: He is involved in a study at [employer] and he recently had some lab work done and he tells me (no results available for me to look at today) that his PLT count was about 134. He was told by them to see his PCP about this.  Hyperlipidemia This is a chronic problem. The current episode started more than 1 year ago. The problem is controlled. Recent lipid tests were reviewed and are variable. He has no history of chronic renal disease, diabetes, hypothyroidism, liver disease, obesity or nephrotic syndrome. There are no known factors aggravating his hyperlipidemia. Pertinent negatives include no chest pain, focal sensory loss, focal weakness, leg pain, myalgias or shortness of breath. Current antihyperlipidemic treatment includes statins. The current treatment provides mild improvement of lipids. Compliance problems include medication side effects (he thinks pravachol caused diarrhea).       Review of Systems  Constitutional: Negative.  Negative for fever, chills, diaphoresis, appetite change and fatigue.  HENT: Negative.  Negative for mouth sores, nosebleeds and sore throat.   Eyes: Negative.   Respiratory: Negative for cough, chest tightness, shortness of breath, wheezing and stridor.   Cardiovascular: Negative.  Negative for chest pain, palpitations and leg swelling.  Gastrointestinal: Negative.  Negative for nausea, vomiting, abdominal pain, diarrhea, constipation and blood in stool.  Endocrine: Negative.   Genitourinary: Negative.  Negative for frequency, hematuria, flank pain and difficulty urinating.  Musculoskeletal: Negative.  Negative for arthralgias, back pain, gait problem, joint swelling, myalgias, neck pain and neck stiffness.  Skin: Negative.   Allergic/Immunologic: Negative.   Neurological: Negative.  Negative for dizziness and focal weakness.  Hematological:  Negative.  Negative for adenopathy. Does not bruise/bleed easily.  Psychiatric/Behavioral: Negative.        Objective:   Physical Exam  Vitals reviewed. Constitutional: He is oriented to person, place, and time. He appears well-developed and well-nourished. No distress.  HENT:  Head: Normocephalic and atraumatic.  Mouth/Throat: Oropharynx is clear and moist. No oropharyngeal exudate.  Eyes: Conjunctivae are normal. Right eye exhibits no discharge. Left eye exhibits no discharge. No scleral icterus.  Neck: Normal range of motion. Neck supple. No JVD present. No tracheal deviation present. No thyromegaly present.  Cardiovascular: Normal rate, regular rhythm, normal heart sounds and intact distal pulses.  Exam reveals no gallop and no friction rub.   No murmur heard. Pulmonary/Chest: Effort normal and breath sounds normal. No stridor. No respiratory distress. He has no wheezes. He has no rales. He exhibits no tenderness.  Abdominal: Soft. Bowel sounds are normal. He exhibits no distension and no mass. There is no tenderness. There is no rebound and no guarding.  Musculoskeletal: Normal range of motion. He exhibits no edema and no tenderness.  Lymphadenopathy:    He has no cervical adenopathy.  Neurological: He is oriented to person, place, and time.  Skin: Skin is warm and dry. No abrasion, no bruising, no ecchymosis, no petechiae and no rash noted. Rash is not papular. He is not diaphoretic. No cyanosis or erythema. No pallor. Nails show no clubbing.  Psychiatric: He has a normal mood and affect. His behavior is normal. Judgment and thought content normal.     Lab Results  Component Value Date   WBC 9.7 11/03/2012   HGB 14.7 11/03/2012   HCT 42.4 11/03/2012   PLT 278.0 11/03/2012   GLUCOSE 94 11/03/2012  CHOL 242* 11/03/2012   TRIG 250.0* 11/03/2012   HDL 56.90 11/03/2012   LDLDIRECT 162.7 11/03/2012   ALT 22 11/03/2012   AST 20 11/03/2012   NA 140 11/03/2012   K 4.0 11/03/2012   CL 107  11/03/2012   CREATININE 1.1 11/03/2012   BUN 17 11/03/2012   CO2 28 11/03/2012   TSH 0.86 11/03/2012   PSA 0.56 11/03/2012       Assessment & Plan:

## 2013-02-07 NOTE — Progress Notes (Signed)
Pre visit review using our clinic review tool, if applicable. No additional management support is needed unless otherwise documented below in the visit note. 

## 2013-02-07 NOTE — Patient Instructions (Signed)

## 2013-02-08 NOTE — Assessment & Plan Note (Signed)
He will start livalo

## 2013-02-08 NOTE — Addendum Note (Signed)
Addended by: Maple Hudson on: 02/08/2013 12:37 PM   Modules accepted: Level of Service

## 2013-02-08 NOTE — Assessment & Plan Note (Signed)
I have told him that if he does have low platelets that he should stop drinking alcohol, however today his PLT count is normal

## 2013-03-30 ENCOUNTER — Ambulatory Visit (INDEPENDENT_AMBULATORY_CARE_PROVIDER_SITE_OTHER): Payer: BC Managed Care – PPO | Admitting: Internal Medicine

## 2013-03-30 ENCOUNTER — Encounter: Payer: Self-pay | Admitting: Internal Medicine

## 2013-03-30 VITALS — BP 130/72 | HR 84 | Temp 98.5°F | Resp 16 | Ht 71.0 in | Wt 170.1 lb

## 2013-03-30 DIAGNOSIS — L300 Nummular dermatitis: Secondary | ICD-10-CM

## 2013-03-30 DIAGNOSIS — L259 Unspecified contact dermatitis, unspecified cause: Secondary | ICD-10-CM

## 2013-03-30 MED ORDER — CLOBETASOL PROPIONATE 0.05 % EX OINT: 1.0000 "application " | TOPICAL_OINTMENT | Freq: Two times a day (BID) | CUTANEOUS | Status: DC

## 2013-03-30 NOTE — Progress Notes (Signed)
Pre visit review using our clinic review tool, if applicable. No additional management support is needed unless otherwise documented below in the visit note. 

## 2013-03-30 NOTE — Progress Notes (Signed)
   Subjective:    Patient ID: Stephen Davila, male    DOB: May 02, 1960, 53 y.o.   MRN: [id number]  Rash This is a new problem. The current episode started 1 to 4 weeks ago. The problem is unchanged. The affected locations include the right arm. The rash is characterized by itchiness and dryness. He was exposed to nothing. Pertinent negatives include no anorexia, congestion, cough, diarrhea, eye pain, facial edema, fatigue, fever, joint pain, nail changes, rhinorrhea, shortness of breath, sore throat or vomiting. Past treatments include antibiotic cream. The treatment provided no relief.      Review of Systems  Constitutional: Negative.  Negative for fever, chills, diaphoresis, appetite change and fatigue.  HENT: Negative.  Negative for congestion, rhinorrhea and sore throat.   Eyes: Negative.  Negative for pain.  Respiratory: Negative.  Negative for cough, choking, chest tightness, shortness of breath and stridor.   Cardiovascular: Negative.  Negative for chest pain, palpitations and leg swelling.  Gastrointestinal: Negative.  Negative for vomiting, diarrhea and anorexia.  Endocrine: Negative.   Genitourinary: Negative.   Musculoskeletal: Negative.  Negative for joint pain.  Skin: Positive for rash. Negative for nail changes.  Allergic/Immunologic: Negative.   Neurological: Negative.   Hematological: Negative.  Negative for adenopathy. Does not bruise/bleed easily.  Psychiatric/Behavioral: Negative.        Objective:   Physical Exam  Vitals reviewed. Constitutional: He is oriented to person, place, and time. He appears well-developed and well-nourished. No distress.  HENT:  Head: Normocephalic and atraumatic.  Mouth/Throat: Oropharynx is clear and moist. No oropharyngeal exudate.  Eyes: Conjunctivae are normal. Right eye exhibits no discharge. Left eye exhibits no discharge. No scleral icterus.  Neck: Normal range of motion. Neck supple. No JVD present. No tracheal deviation  present. No thyromegaly present.  Cardiovascular: Normal rate, regular rhythm, normal heart sounds and intact distal pulses.  Exam reveals no gallop and no friction rub.   No murmur heard. Pulmonary/Chest: Effort normal and breath sounds normal. No stridor. No respiratory distress. He has no wheezes. He has no rales. He exhibits no tenderness.  Abdominal: Soft. Bowel sounds are normal. He exhibits no distension and no mass. There is no tenderness. There is no rebound and no guarding.  Musculoskeletal: Normal range of motion. He exhibits no edema and no tenderness.  Lymphadenopathy:    He has no cervical adenopathy.  Neurological: He is oriented to person, place, and time.  Skin: Skin is warm and dry. Rash noted. He is not diaphoretic. No erythema. No pallor.        Lab Results  Component Value Date   WBC 10.2 02/07/2013   HGB 15.4 02/07/2013   HCT 44.4 02/07/2013   PLT 291.0 02/07/2013   GLUCOSE 94 11/03/2012   CHOL 242* 11/03/2012   TRIG 250.0* 11/03/2012   HDL 56.90 11/03/2012   LDLDIRECT 162.7 11/03/2012   ALT 22 11/03/2012   AST 20 11/03/2012   NA 140 11/03/2012   K 4.0 11/03/2012   CL 107 11/03/2012   CREATININE 1.1 11/03/2012   BUN 17 11/03/2012   CO2 28 11/03/2012   TSH 0.86 11/03/2012   PSA 0.56 11/03/2012       Assessment & Plan:

## 2013-03-30 NOTE — Patient Instructions (Signed)
Eczema Eczema, also called atopic dermatitis, is a skin disorder that causes inflammation of the skin. It causes a red rash and dry, scaly skin. The skin becomes very itchy. Eczema is generally worse during the cooler winter months and often improves with the warmth of summer. Eczema usually starts showing signs in infancy. Some children outgrow eczema, but it may last through adulthood.  CAUSES  The exact cause of eczema is not known, but it appears to run in families. People with eczema often have a family history of eczema, allergies, asthma, or hay fever. Eczema is not contagious. Flare-ups of the condition may be caused by:   Contact with something you are sensitive or allergic to.   Stress. SIGNS AND SYMPTOMS  Dry, scaly skin.   Red, itchy rash.   Itchiness. This may occur before the skin rash and may be very intense.  DIAGNOSIS  The diagnosis of eczema is usually made based on symptoms and medical history. TREATMENT  Eczema cannot be cured, but symptoms usually can be controlled with treatment and other strategies. A treatment plan might include:  Controlling the itching and scratching.   Use over-the-counter antihistamines as directed for itching. This is especially useful at night when the itching tends to be worse.   Use over-the-counter steroid creams as directed for itching.   Avoid scratching. Scratching makes the rash and itching worse. It may also result in a skin infection (impetigo) due to a break in the skin caused by scratching.   Keeping the skin well moisturized with creams every day. This will seal in moisture and help prevent dryness. Lotions that contain alcohol and water should be avoided because they can dry the skin.   Limiting exposure to things that you are sensitive or allergic to (allergens).   Recognizing situations that cause stress.   Developing a plan to manage stress.  HOME CARE INSTRUCTIONS   Only take over-the-counter or  prescription medicines as directed by your health care provider.   Do not use anything on the skin without checking with your health care provider.   Keep baths or showers short (5 minutes) in warm (not hot) water. Use mild cleansers for bathing. These should be unscented. You may add nonperfumed bath oil to the bath water. It is best to avoid soap and bubble bath.   Immediately after a bath or shower, when the skin is still damp, apply a moisturizing ointment to the entire body. This ointment should be a petroleum ointment. This will seal in moisture and help prevent dryness. The thicker the ointment, the better. These should be unscented.   Keep fingernails cut short. Children with eczema may need to wear soft gloves or mittens at night after applying an ointment.   Dress in clothes made of cotton or cotton blends. Dress lightly, because heat increases itching.   A child with eczema should stay away from anyone with fever blisters or cold sores. The virus that causes fever blisters (herpes simplex) can cause a serious skin infection in children with eczema. SEEK MEDICAL CARE IF:   Your itching interferes with sleep.   Your rash gets worse or is not better within 1 week after starting treatment.   You see pus or soft yellow scabs in the rash area.   You have a fever.   You have a rash flare-up after contact with someone who has fever blisters.  Document Released: 02/22/2000 Document Revised: 12/15/2012 Document Reviewed: 09/27/2012 [facility] Patient Information 2014 [place].

## 2013-03-31 NOTE — Assessment & Plan Note (Signed)
I have asked him to stop using neosporin and to start using clobetasol ointment If these areas do not heal quickly then I will consider doing a biopsy to see if there is concern for psoriasis

## 2014-01-16 ENCOUNTER — Ambulatory Visit (INDEPENDENT_AMBULATORY_CARE_PROVIDER_SITE_OTHER): Payer: BC Managed Care – PPO | Admitting: Internal Medicine

## 2014-01-16 ENCOUNTER — Encounter: Payer: Self-pay | Admitting: Internal Medicine

## 2014-01-16 VITALS — BP 134/80 | HR 94 | Temp 98.5°F | Resp 16 | Ht 71.0 in | Wt 170.0 lb

## 2014-01-16 DIAGNOSIS — I2510 Atherosclerotic heart disease of native coronary artery without angina pectoris: Secondary | ICD-10-CM

## 2014-01-16 DIAGNOSIS — J449 Chronic obstructive pulmonary disease, unspecified: Secondary | ICD-10-CM

## 2014-01-16 DIAGNOSIS — E785 Hyperlipidemia, unspecified: Secondary | ICD-10-CM

## 2014-01-16 MED ORDER — PANTOPRAZOLE SODIUM 40 MG PO TBEC: 40.0000 mg | DELAYED_RELEASE_TABLET | Freq: Every day | ORAL | Status: DC

## 2014-01-16 MED ORDER — BUDESONIDE-FORMOTEROL FUMARATE 80-4.5 MCG/ACT IN AERO: 2.0000 | INHALATION_SPRAY | Freq: Two times a day (BID) | RESPIRATORY_TRACT | Status: DC

## 2014-01-16 MED ORDER — ALBUTEROL SULFATE HFA 108 (90 BASE) MCG/ACT IN AERS: 2.0000 | INHALATION_SPRAY | Freq: Four times a day (QID) | RESPIRATORY_TRACT | Status: DC | PRN

## 2014-01-16 MED ORDER — PRAVASTATIN SODIUM 40 MG PO TABS: 40.0000 mg | ORAL_TABLET | Freq: Every day | ORAL | Status: DC

## 2014-01-16 NOTE — Patient Instructions (Signed)

## 2014-01-16 NOTE — Progress Notes (Signed)
Pre visit review using our clinic review tool, if applicable. No additional management support is needed unless otherwise documented below in the visit note. 

## 2014-01-17 NOTE — Assessment & Plan Note (Signed)
He is doing well on the statin Will recheck his FLP today to see that he has met his LDL goal

## 2014-01-17 NOTE — Assessment & Plan Note (Signed)
He is doing well on symbicort

## 2014-01-17 NOTE — Progress Notes (Signed)
   Subjective:    Patient ID: Stephen Davila, male    DOB: [birth year], 53 y.o.   MRN: [id number]  Hyperlipidemia This is a chronic problem. The current episode started more than 1 year ago. The problem is controlled. Recent lipid tests were reviewed and are variable. He has no history of chronic renal disease, diabetes, hypothyroidism, liver disease, obesity or nephrotic syndrome. There are no known factors aggravating his hyperlipidemia. Pertinent negatives include no chest pain, focal sensory loss, focal weakness, leg pain, myalgias or shortness of breath. Current antihyperlipidemic treatment includes statins and diet change. The current treatment provides moderate improvement of lipids. There are no compliance problems.       Review of Systems  Constitutional: Negative.  Negative for fever, chills, diaphoresis, appetite change and fatigue.  HENT: Negative.   Eyes: Negative.   Respiratory: Negative.  Negative for cough, choking, chest tightness, shortness of breath and stridor.   Cardiovascular: Negative.  Negative for chest pain, palpitations and leg swelling.  Gastrointestinal: Negative.  Negative for nausea, vomiting, abdominal pain, diarrhea and constipation.  Endocrine: Negative.   Genitourinary: Negative.   Musculoskeletal: Negative.  Negative for myalgias, back pain, joint swelling, arthralgias and neck pain.  Skin: Negative.  Negative for rash.  Allergic/Immunologic: Negative.   Neurological: Negative.  Negative for focal weakness.  Hematological: Negative.  Negative for adenopathy. Does not bruise/bleed easily.  Psychiatric/Behavioral: Negative.        Objective:   Physical Exam  Constitutional: He is oriented to person, place, and time. He appears well-developed and well-nourished. No distress.  HENT:  Head: Normocephalic and atraumatic.  Mouth/Throat: Oropharynx is clear and moist. No oropharyngeal exudate.  Eyes: Conjunctivae are normal. Right eye exhibits no  discharge. Left eye exhibits no discharge. No scleral icterus.  Neck: Normal range of motion. Neck supple. No JVD present. No tracheal deviation present. No thyromegaly present.  Cardiovascular: Normal rate, regular rhythm, normal heart sounds and intact distal pulses.  Exam reveals no gallop and no friction rub.   No murmur heard. Pulmonary/Chest: Effort normal and breath sounds normal. No stridor. No respiratory distress. He has no wheezes. He has no rales. He exhibits no tenderness.  Abdominal: Soft. Bowel sounds are normal. He exhibits no distension and no mass. There is no tenderness. There is no rebound and no guarding.  Musculoskeletal: Normal range of motion. He exhibits no edema or tenderness.  Lymphadenopathy:    He has no cervical adenopathy.  Neurological: He is oriented to person, place, and time.  Skin: Skin is warm and dry. No rash noted. He is not diaphoretic. No erythema. No pallor.  Vitals reviewed.    Lab Results  Component Value Date   WBC 10.2 02/07/2013   HGB 15.4 02/07/2013   HCT 44.4 02/07/2013   PLT 291.0 02/07/2013   GLUCOSE 94 11/03/2012   CHOL 242* 11/03/2012   TRIG 250.0* 11/03/2012   HDL 56.90 11/03/2012   LDLDIRECT 162.7 11/03/2012   ALT 22 11/03/2012   AST 20 11/03/2012   NA 140 11/03/2012   K 4.0 11/03/2012   CL 107 11/03/2012   CREATININE 1.1 11/03/2012   BUN 17 11/03/2012   CO2 28 11/03/2012   TSH 0.86 11/03/2012   PSA 0.56 11/03/2012       Assessment & Plan:

## 2014-01-19 ENCOUNTER — Other Ambulatory Visit (INDEPENDENT_AMBULATORY_CARE_PROVIDER_SITE_OTHER): Payer: BC Managed Care – PPO

## 2014-01-19 ENCOUNTER — Other Ambulatory Visit: Payer: Self-pay | Admitting: Internal Medicine

## 2014-01-19 ENCOUNTER — Encounter: Payer: Self-pay | Admitting: Internal Medicine

## 2014-01-19 DIAGNOSIS — I2510 Atherosclerotic heart disease of native coronary artery without angina pectoris: Secondary | ICD-10-CM

## 2014-01-19 DIAGNOSIS — E785 Hyperlipidemia, unspecified: Secondary | ICD-10-CM

## 2014-01-19 DIAGNOSIS — E781 Pure hyperglyceridemia: Secondary | ICD-10-CM | POA: Insufficient documentation

## 2014-01-19 LAB — COMPREHENSIVE METABOLIC PANEL
ALBUMIN: 3.3 g/dL — AB (ref 3.5–5.2)
ALK PHOS: 50 U/L (ref 39–117)
ALT: 26 U/L (ref 0–53)
AST: 29 U/L (ref 0–37)
BILIRUBIN TOTAL: 0.4 mg/dL (ref 0.2–1.2)
BUN: 16 mg/dL (ref 6–23)
CO2: 19 mEq/L (ref 19–32)
Calcium: 9 mg/dL (ref 8.4–10.5)
Chloride: 107 mEq/L (ref 96–112)
Creatinine, Ser: 1 mg/dL (ref 0.4–1.5)
GFR: 86.3 mL/min (ref >60.00)
Glucose, Bld: 98 mg/dL (ref 70–99)
Potassium: 4.3 mEq/L (ref 3.5–5.1)
SODIUM: 140 meq/L (ref 135–145)
TOTAL PROTEIN: 6.6 g/dL (ref 6.0–8.3)

## 2014-01-19 LAB — LIPID PANEL
Cholesterol: 255 mg/dL — ABNORMAL HIGH (ref 0–200)
HDL: 49.7 mg/dL (ref >39.00)
NONHDL: 205.3
Total CHOL/HDL Ratio: 5
Triglycerides: 512 mg/dL — ABNORMAL HIGH (ref 0.0–149.0)
VLDL: 102.4 mg/dL — AB (ref 0.0–40.0)

## 2014-01-19 LAB — CBC WITH DIFFERENTIAL/PLATELET
Basophils Absolute: 0.1 10*3/uL (ref 0.0–0.1)
Basophils Relative: 0.7 % (ref 0.0–3.0)
EOS ABS: 0.3 10*3/uL (ref 0.0–0.7)
Eosinophils Relative: 3.4 % (ref 0.0–5.0)
HEMATOCRIT: 45.5 % (ref 39.0–52.0)
Hemoglobin: 15.6 g/dL (ref 13.0–17.0)
LYMPHS ABS: 2.5 10*3/uL (ref 0.7–4.0)
Lymphocytes Relative: 29.8 % (ref 12.0–46.0)
MCHC: 34.4 g/dL (ref 30.0–36.0)
MCV: 96.7 fl (ref 78.0–100.0)
MONO ABS: 0.8 10*3/uL (ref 0.1–1.0)
Monocytes Relative: 9.9 % (ref 3.0–12.0)
NEUTROS PCT: 56.2 % (ref 43.0–77.0)
Neutro Abs: 4.7 10*3/uL (ref 1.4–7.7)
PLATELETS: 279 10*3/uL (ref 150.0–400.0)
RBC: 4.7 Mil/uL (ref 4.22–5.81)
RDW: 12.9 % (ref 11.5–15.5)
WBC: 8.3 10*3/uL (ref 4.0–10.5)

## 2014-01-19 LAB — TSH: TSH: 1.46 u[IU]/mL (ref 0.35–4.50)

## 2014-01-19 LAB — LDL CHOLESTEROL, DIRECT: LDL DIRECT: 116.4 mg/dL

## 2014-01-19 MED ORDER — ICOSAPENT ETHYL 1 G PO CAPS: 2.0000 | ORAL_CAPSULE | Freq: Two times a day (BID) | ORAL | Status: DC

## 2015-04-11 ENCOUNTER — Other Ambulatory Visit: Payer: Self-pay | Admitting: Internal Medicine

## 2015-07-02 ENCOUNTER — Ambulatory Visit (INDEPENDENT_AMBULATORY_CARE_PROVIDER_SITE_OTHER): Payer: BLUE CROSS/BLUE SHIELD | Admitting: Internal Medicine

## 2015-07-02 ENCOUNTER — Other Ambulatory Visit (INDEPENDENT_AMBULATORY_CARE_PROVIDER_SITE_OTHER): Payer: BLUE CROSS/BLUE SHIELD

## 2015-07-02 ENCOUNTER — Encounter: Payer: Self-pay | Admitting: Internal Medicine

## 2015-07-02 VITALS — BP 158/80 | HR 105 | Temp 98.0°F | Resp 16 | Ht 71.0 in

## 2015-07-02 DIAGNOSIS — I2510 Atherosclerotic heart disease of native coronary artery without angina pectoris: Secondary | ICD-10-CM | POA: Diagnosis not present

## 2015-07-02 DIAGNOSIS — J449 Chronic obstructive pulmonary disease, unspecified: Secondary | ICD-10-CM | POA: Diagnosis not present

## 2015-07-02 DIAGNOSIS — Z0000 Encounter for general adult medical examination without abnormal findings: Secondary | ICD-10-CM

## 2015-07-02 DIAGNOSIS — E781 Pure hyperglyceridemia: Secondary | ICD-10-CM

## 2015-07-02 DIAGNOSIS — E785 Hyperlipidemia, unspecified: Secondary | ICD-10-CM

## 2015-07-02 DIAGNOSIS — R7989 Other specified abnormal findings of blood chemistry: Secondary | ICD-10-CM

## 2015-07-02 DIAGNOSIS — I10 Essential (primary) hypertension: Secondary | ICD-10-CM | POA: Insufficient documentation

## 2015-07-02 LAB — CBC WITH DIFFERENTIAL/PLATELET
BASOS ABS: 0 10*3/uL (ref 0.0–0.1)
Basophils Relative: 0.3 % (ref 0.0–3.0)
EOS ABS: 0.3 10*3/uL (ref 0.0–0.7)
Eosinophils Relative: 2.4 % (ref 0.0–5.0)
HEMATOCRIT: 44.2 % (ref 39.0–52.0)
Hemoglobin: 15.2 g/dL (ref 13.0–17.0)
LYMPHS PCT: 23 % (ref 12.0–46.0)
Lymphs Abs: 2.6 10*3/uL (ref 0.7–4.0)
MCHC: 34.5 g/dL (ref 30.0–36.0)
MCV: 96 fl (ref 78.0–100.0)
MONOS PCT: 8.1 % (ref 3.0–12.0)
Monocytes Absolute: 0.9 10*3/uL (ref 0.1–1.0)
NEUTROS ABS: 7.5 10*3/uL (ref 1.4–7.7)
Neutrophils Relative %: 66.2 % (ref 43.0–77.0)
PLATELETS: 279 10*3/uL (ref 150.0–400.0)
RBC: 4.6 Mil/uL (ref 4.22–5.81)
RDW: 12.3 % (ref 11.5–15.5)
WBC: 11.3 10*3/uL — AB (ref 4.0–10.5)

## 2015-07-02 LAB — PSA: PSA: 0.74 ng/mL (ref 0.10–4.00)

## 2015-07-02 LAB — HEPATITIS C ANTIBODY: HCV AB: NEGATIVE

## 2015-07-02 LAB — LIPID PANEL
CHOL/HDL RATIO: 4
CHOLESTEROL: 224 mg/dL — AB (ref 0–200)
HDL: 54.1 mg/dL (ref >39.00)
Triglycerides: 522 mg/dL — ABNORMAL HIGH (ref 0.0–149.0)

## 2015-07-02 LAB — COMPREHENSIVE METABOLIC PANEL
ALBUMIN: 4 g/dL (ref 3.5–5.2)
ALT: 22 U/L (ref 0–53)
AST: 23 U/L (ref 0–37)
Alkaline Phosphatase: 59 U/L (ref 39–117)
BILIRUBIN TOTAL: 0.5 mg/dL (ref 0.2–1.2)
BUN: 18 mg/dL (ref 6–23)
CALCIUM: 9.1 mg/dL (ref 8.4–10.5)
CHLORIDE: 103 meq/L (ref 96–112)
CO2: 27 meq/L (ref 19–32)
CREATININE: 0.96 mg/dL (ref 0.40–1.50)
GFR: 85.85 mL/min (ref >60.00)
GLUCOSE: 105 mg/dL — AB (ref 70–99)
POTASSIUM: 4.1 meq/L (ref 3.5–5.1)
Sodium: 139 mEq/L (ref 135–145)
TOTAL PROTEIN: 6.6 g/dL (ref 6.0–8.3)

## 2015-07-02 LAB — LDL CHOLESTEROL, DIRECT: Direct LDL: 81 mg/dL

## 2015-07-02 LAB — TSH: TSH: 1.67 u[IU]/mL (ref 0.35–4.50)

## 2015-07-02 LAB — HIV ANTIBODY (ROUTINE TESTING W REFLEX): HIV: NONREACTIVE

## 2015-07-02 MED ORDER — BUDESONIDE-FORMOTEROL FUMARATE 80-4.5 MCG/ACT IN AERO: 2.0000 | INHALATION_SPRAY | Freq: Two times a day (BID) | RESPIRATORY_TRACT | Status: DC

## 2015-07-02 MED ORDER — ASPIRIN EC 81 MG PO TBEC: 81.0000 mg | DELAYED_RELEASE_TABLET | Freq: Every day | ORAL | Status: DC

## 2015-07-02 MED ORDER — TELMISARTAN 40 MG PO TABS
40.0000 mg | ORAL_TABLET | Freq: Every day | ORAL | Status: DC
Start: 2015-07-02 — End: 2015-12-28

## 2015-07-02 MED ORDER — ALBUTEROL SULFATE HFA 108 (90 BASE) MCG/ACT IN AERS: 2.0000 | INHALATION_SPRAY | Freq: Four times a day (QID) | RESPIRATORY_TRACT | Status: DC | PRN

## 2015-07-02 MED ORDER — PRAVASTATIN SODIUM 40 MG PO TABS: 40.0000 mg | ORAL_TABLET | Freq: Every day | ORAL | Status: DC

## 2015-07-02 NOTE — Patient Instructions (Signed)

## 2015-07-02 NOTE — Progress Notes (Signed)
Pre visit review using our clinic review tool, if applicable. No additional management support is needed unless otherwise documented below in the visit note. 

## 2015-07-02 NOTE — Progress Notes (Signed)
Subjective:  Patient ID: Stephen Davila, male    DOB: 1961-02-25  Age: 55 y.o. MRN: [id number]  CC: Annual Exam; Hypertension; and Hyperlipidemia   HPI Stephen Davila presents for CPX -  He is due for blood pressure check, he doesn't think his blood pressure has been well controlled but fortunately he denies headache, blurred vision, chest pain, shortness of breath, edema, or palpitations.  He is also due for follow-up on his lipids. He is doing well on pravastatin but was also found to have high triglycerides and is been noncompliant with the omega-3 fish oil supplement.  History Stephen Davila has a past medical history of COPD (chronic obstructive pulmonary disease) ([place]).   He has past surgical history that includes Cervical spine surgery (10-01-12).   His family history includes Heart disease in his father; Melanoma in his sister; Prostate cancer in his father. There is no history of Diabetes, Stroke, Kidney disease, Hypertension, Hyperlipidemia, Colon cancer, Pancreatic cancer, or Stomach cancer.He reports that he quit smoking about 6 years ago. He has never used smokeless tobacco. He reports that he drinks about 16.8 oz of alcohol per week. He reports that he does not use illicit drugs.  Outpatient Prescriptions Prior to Visit  Medication Sig Dispense Refill  . clobetasol ointment (TEMOVATE) [id number] % Apply 1 application topically 2 (two) times daily. 45 g 1  . pantoprazole (PROTONIX) 40 MG tablet Take 1 tablet (40 mg total) by mouth daily. 90 tablet 3  . pravastatin (PRAVACHOL) 40 MG tablet Take 1 tablet (40 mg total) by mouth daily. ---patient needs office visit before any further refills 90 tablet 0  . albuterol (VENTOLIN HFA) 108 (90 BASE) MCG/ACT inhaler Inhale 2 puffs into the lungs every 6 (six) hours as needed for wheezing. (Patient not taking: Reported on 07/02/2015) 1 Inhaler 11  . budesonide-formoterol (SYMBICORT) 80-4.5 MCG/ACT inhaler Inhale 2 puffs into the lungs  2 (two) times daily. (Patient not taking: Reported on 07/02/2015) 1 Inhaler 11  . Icosapent Ethyl (VASCEPA) 1 G CAPS Take 2 capsules by mouth 2 (two) times daily. (Patient not taking: Reported on 07/02/2015) 120 capsule 11   No facility-administered medications prior to visit.    ROS Review of Systems  Constitutional: Negative.   HENT: Negative.   Eyes: Negative.  Negative for visual disturbance.  Respiratory: Negative.  Negative for cough, choking, chest tightness, shortness of breath and stridor.   Cardiovascular: Negative.  Negative for chest pain, palpitations and leg swelling.  Gastrointestinal: Negative.  Negative for nausea, vomiting, abdominal pain, constipation and blood in stool.  Endocrine: Negative.   Genitourinary: Negative.  Negative for dysuria, urgency, hematuria, decreased urine volume, difficulty urinating and genital sores.  Musculoskeletal: Negative.  Negative for myalgias, back pain and arthralgias.  Skin: Negative.   Allergic/Immunologic: Negative.   Neurological: Negative.  Negative for dizziness, tremors, weakness, light-headedness and numbness.  Hematological: Negative.  Negative for adenopathy. Does not bruise/bleed easily.  Psychiatric/Behavioral: Negative.     Objective:  BP 158/80 mmHg  Pulse 105  Temp(Src) 98 F (36.7 C) (Oral)  Resp 16  Ht 5\' 11"  (1.803 m)  SpO2 95%  Physical Exam  Constitutional: He is oriented to person, place, and time. He appears well-developed and well-nourished. No distress.  HENT:  Mouth/Throat: Oropharynx is clear and moist. No oropharyngeal exudate.  Eyes: Conjunctivae are normal. Right eye exhibits no discharge. Left eye exhibits no discharge. No scleral icterus.  Neck: Normal range of motion. Neck supple. No JVD present. No tracheal deviation  present. No thyromegaly present.  Cardiovascular: Normal rate, regular rhythm, normal heart sounds and intact distal pulses.  Exam reveals no gallop and no friction rub.   No murmur  heard. EKG ----  Sinus  Rhythm  WITHIN NORMAL LIMITS   Pulmonary/Chest: Effort normal and breath sounds normal. No stridor. No respiratory distress. He has no wheezes. He has no rales. He exhibits no tenderness.  Abdominal: Soft. Bowel sounds are normal. He exhibits no distension and no mass. There is no tenderness. There is no rebound and no guarding. Hernia confirmed negative in the right inguinal area and confirmed negative in the left inguinal area.  Genitourinary: Rectum normal, prostate normal, testes normal and penis normal. Rectal exam shows no external hemorrhoid, no internal hemorrhoid, no fissure, no mass, no tenderness and anal tone normal. Guaiac negative stool. Prostate is not enlarged and not tender. Right testis shows no mass, no swelling and no tenderness. Right testis is descended. Left testis shows no mass, no swelling and no tenderness. Left testis is descended. Circumcised. No penile erythema or penile tenderness. No discharge found.  Musculoskeletal: Normal range of motion. He exhibits no edema or tenderness.  Lymphadenopathy:       Right: No inguinal adenopathy present.       Left: No inguinal adenopathy present.  Neurological: He is oriented to person, place, and time.  Skin: Skin is warm and dry. No rash noted. He is not diaphoretic. No erythema. No pallor.  Vitals reviewed.   Lab Results  Component Value Date   WBC 11.3* 07/02/2015   HGB 15.2 07/02/2015   HCT 44.2 07/02/2015   PLT 279.0 07/02/2015   GLUCOSE 105* 07/02/2015   CHOL 224* 07/02/2015   TRIG * 07/02/2015    522.0 Triglyceride is over 400; calculations on Lipids are invalid.   HDL 54.10 07/02/2015   LDLDIRECT 81.0 07/02/2015   ALT 22 07/02/2015   AST 23 07/02/2015   NA 139 07/02/2015   K 4.1 07/02/2015   CL 103 07/02/2015   CREATININE 0.96 07/02/2015   BUN 18 07/02/2015   CO2 27 07/02/2015   TSH 1.67 07/02/2015   PSA 0.74 07/02/2015    Assessment & Plan:   Stephen Davila was seen today for  annual exam, hypertension and hyperlipidemia.  Diagnoses and all orders for this visit:  COPD GOLD I -     budesonide-formoterol (SYMBICORT) 80-4.5 MCG/ACT inhaler; Inhale 2 puffs into the lungs 2 (two) times daily. -     albuterol (VENTOLIN HFA) 108 (90 Base) MCG/ACT inhaler; Inhale 2 puffs into the lungs every 6 (six) hours as needed for wheezing.  Routine general medical examination at a health care facility- exam completed, labs ordered and reviewed, his colonoscopy is up-to-date, patient education material was given. -     Lipid panel; Future -     Comprehensive metabolic panel; Future -     CBC with Differential/Platelet; Future -     PSA; Future -     TSH; Future -     Hepatitis C antibody; Future -     HIV antibody; Future -     EKG 12-Lead  Hyperlipidemia with target LDL less than 100- he has achieved his LDL goal is doing well on the statin -     pravastatin (PRAVACHOL) 40 MG tablet; Take 1 tablet (40 mg total) by mouth daily. ---patient needs office visit before any further refills  Atherosclerosis of native coronary artery of native heart without angina pectoris- he had atherosclerosis found  on a prior CT scan but his had no complications such as MI or angina. He has had no recent episodes of chest pain or shortness of breath. Will continue risk factor modification with statin, have asked him to start an aspirin a day, will start an ARB, and will treat his triglycerides as well. -     pravastatin (PRAVACHOL) 40 MG tablet; Take 1 tablet (40 mg total) by mouth daily. ---patient needs office visit before any further refills -     aspirin EC 81 MG tablet; Take 1 tablet (81 mg total) by mouth daily. -     telmisartan (MICARDIS) 40 MG tablet; Take 1 tablet (40 mg total) by mouth daily.  Essential hypertension- his blood pressure is not adequately well controlled, his labs do not indicate any secondary causes for hypertension or end organ damage, I've asked him to start an ARB for  blood pressure reduction. -     telmisartan (MICARDIS) 40 MG tablet; Take 1 tablet (40 mg total) by mouth daily.  Hypertriglyceridemia- his triglycerides are too high, will restart omega-3 fish oils to treat this and to prevent complications. -     Discontinue: Icosapent Ethyl (VASCEPA) 1 g CAPS; Take 2 capsules by mouth 2 (two) times daily.   I have discontinued Mr. Torno Icosapent Ethyl. I am also having him start on aspirin EC and telmisartan. Additionally, I am having him maintain his clobetasol ointment, pantoprazole, pravastatin, budesonide-formoterol, and albuterol.  Meds ordered this encounter  Medications  . pravastatin (PRAVACHOL) 40 MG tablet    Sig: Take 1 tablet (40 mg total) by mouth daily. ---patient needs office visit before any further refills    Dispense:  90 tablet    Refill:  3  . budesonide-formoterol (SYMBICORT) 80-4.5 MCG/ACT inhaler    Sig: Inhale 2 puffs into the lungs 2 (two) times daily.    Dispense:  1 Inhaler    Refill:  11  . albuterol (VENTOLIN HFA) 108 (90 Base) MCG/ACT inhaler    Sig: Inhale 2 puffs into the lungs every 6 (six) hours as needed for wheezing.    Dispense:  1 Inhaler    Refill:  11    Order Specific Question:  Lot Number?    Answer:  [id number]    Order Specific Question:  Expiration Date?    Answer:  12/08/2013    Order Specific Question:  Quantity    Answer:  1     Comments:  1  . aspirin EC 81 MG tablet    Sig: Take 1 tablet (81 mg total) by mouth daily.    Dispense:  90 tablet    Refill:  3  . telmisartan (MICARDIS) 40 MG tablet    Sig: Take 1 tablet (40 mg total) by mouth daily.    Dispense:  90 tablet    Refill:  1  . DISCONTD: Icosapent Ethyl (VASCEPA) 1 g CAPS    Sig: Take 2 capsules by mouth 2 (two) times daily.    Dispense:  120 capsule    Refill:  11     Follow-up: Return in about 3 months (around 10/01/2015).  Scarlette Calico, MD

## 2015-07-03 ENCOUNTER — Encounter: Payer: Self-pay | Admitting: Internal Medicine

## 2015-07-03 MED ORDER — ICOSAPENT ETHYL 1 G PO CAPS: 2.0000 | ORAL_CAPSULE | Freq: Two times a day (BID) | ORAL | Status: DC

## 2015-07-04 ENCOUNTER — Other Ambulatory Visit: Payer: Self-pay | Admitting: Internal Medicine

## 2015-07-04 DIAGNOSIS — E781 Pure hyperglyceridemia: Secondary | ICD-10-CM

## 2015-07-04 MED ORDER — OMEGA-3-ACID ETHYL ESTERS 1 G PO CAPS: 2.0000 g | ORAL_CAPSULE | Freq: Two times a day (BID) | ORAL | Status: DC

## 2015-07-06 ENCOUNTER — Other Ambulatory Visit: Payer: Self-pay | Admitting: Internal Medicine

## 2015-07-14 ENCOUNTER — Other Ambulatory Visit: Payer: Self-pay | Admitting: Internal Medicine

## 2015-10-01 ENCOUNTER — Encounter: Payer: Self-pay | Admitting: Internal Medicine

## 2015-10-01 ENCOUNTER — Other Ambulatory Visit (INDEPENDENT_AMBULATORY_CARE_PROVIDER_SITE_OTHER): Payer: BLUE CROSS/BLUE SHIELD

## 2015-10-01 ENCOUNTER — Ambulatory Visit (INDEPENDENT_AMBULATORY_CARE_PROVIDER_SITE_OTHER): Payer: BLUE CROSS/BLUE SHIELD | Admitting: Internal Medicine

## 2015-10-01 VITALS — BP 142/72 | HR 87 | Temp 98.4°F | Resp 16 | Wt 172.0 lb

## 2015-10-01 DIAGNOSIS — E781 Pure hyperglyceridemia: Secondary | ICD-10-CM

## 2015-10-01 DIAGNOSIS — R739 Hyperglycemia, unspecified: Secondary | ICD-10-CM

## 2015-10-01 DIAGNOSIS — I10 Essential (primary) hypertension: Secondary | ICD-10-CM

## 2015-10-01 DIAGNOSIS — L989 Disorder of the skin and subcutaneous tissue, unspecified: Secondary | ICD-10-CM | POA: Diagnosis not present

## 2015-10-01 LAB — CBC WITH DIFFERENTIAL/PLATELET
BASOS ABS: 0.1 10*3/uL (ref 0.0–0.1)
Basophils Relative: 0.5 % (ref 0.0–3.0)
EOS PCT: 2.5 % (ref 0.0–5.0)
Eosinophils Absolute: 0.3 10*3/uL (ref 0.0–0.7)
HCT: 45.1 % (ref 39.0–52.0)
HEMOGLOBIN: 15.7 g/dL (ref 13.0–17.0)
Lymphocytes Relative: 27.8 % (ref 12.0–46.0)
Lymphs Abs: 3 10*3/uL (ref 0.7–4.0)
MCHC: 34.8 g/dL (ref 30.0–36.0)
MCV: 95.5 fl (ref 78.0–100.0)
MONO ABS: 1 10*3/uL (ref 0.1–1.0)
MONOS PCT: 9.7 % (ref 3.0–12.0)
NEUTROS ABS: 6.5 10*3/uL (ref 1.4–7.7)
Neutrophils Relative %: 59.5 % (ref 43.0–77.0)
Platelets: 271 10*3/uL (ref 150.0–400.0)
RBC: 4.72 Mil/uL (ref 4.22–5.81)
RDW: 12.7 % (ref 11.5–15.5)
WBC: 10.9 10*3/uL — ABNORMAL HIGH (ref 4.0–10.5)

## 2015-10-01 LAB — BASIC METABOLIC PANEL
BUN: 22 mg/dL (ref 6–23)
CHLORIDE: 102 meq/L (ref 96–112)
CO2: 26 meq/L (ref 19–32)
Calcium: 9.1 mg/dL (ref 8.4–10.5)
Creatinine, Ser: 0.96 mg/dL (ref 0.40–1.50)
GFR: 85.77 mL/min (ref >60.00)
GLUCOSE: 82 mg/dL (ref 70–99)
POTASSIUM: 4.7 meq/L (ref 3.5–5.1)
SODIUM: 136 meq/L (ref 135–145)

## 2015-10-01 LAB — HEMOGLOBIN A1C: Hgb A1c MFr Bld: 5.3 % (ref 4.6–6.5)

## 2015-10-01 LAB — TRIGLYCERIDES: TRIGLYCERIDES: 202 mg/dL — AB (ref 0.0–149.0)

## 2015-10-01 NOTE — Patient Instructions (Signed)
Hypertension Hypertension, commonly called high blood pressure, is when the force of blood pumping through your arteries is too strong. Your arteries are the blood vessels that carry blood from your heart throughout your body. A blood pressure reading consists of a higher number over a lower number, such as 110/72. The higher number (systolic) is the pressure inside your arteries when your heart pumps. The lower number (diastolic) is the pressure inside your arteries when your heart relaxes. Ideally you want your blood pressure below 120/80. Hypertension forces your heart to work harder to pump blood. Your arteries may become narrow or stiff. Having untreated or uncontrolled hypertension can cause heart attack, stroke, kidney disease, and other problems. RISK FACTORS Some risk factors for high blood pressure are controllable. Others are not.  Risk factors you cannot control include:   Race. You may be at higher risk if you are African American.  Age. Risk increases with age.  Gender. Men are at higher risk than women before age 45 years. After age 65, women are at higher risk than men. Risk factors you can control include:  Not getting enough exercise or physical activity.  Being overweight.  Getting too much fat, sugar, calories, or salt in your diet.  Drinking too much alcohol. SIGNS AND SYMPTOMS Hypertension does not usually cause signs or symptoms. Extremely high blood pressure (hypertensive crisis) may cause headache, anxiety, shortness of breath, and nosebleed. DIAGNOSIS To check if you have hypertension, your health care provider will measure your blood pressure while you are seated, with your arm held at the level of your heart. It should be measured at least twice using the same arm. Certain conditions can cause a difference in blood pressure between your right and left arms. A blood pressure reading that is higher than normal on one occasion does not mean that you need treatment. If  it is not clear whether you have high blood pressure, you may be asked to return on a different day to have your blood pressure checked again. Or, you may be asked to monitor your blood pressure at home for 1 or more weeks. TREATMENT Treating high blood pressure includes making lifestyle changes and possibly taking medicine. Living a healthy lifestyle can help lower high blood pressure. You may need to change some of your habits. Lifestyle changes may include:  Following the DASH diet. This diet is high in fruits, vegetables, and whole grains. It is low in salt, red meat, and added sugars.  Keep your sodium intake below 2,300 mg per day.  Getting at least 30-45 minutes of aerobic exercise at least 4 times per week.  Losing weight if necessary.  Not smoking.  Limiting alcoholic beverages.  Learning ways to reduce stress. Your health care provider may prescribe medicine if lifestyle changes are not enough to get your blood pressure under control, and if one of the following is true:  You are 18-59 years of age and your systolic blood pressure is above 140.  You are 60 years of age or older, and your systolic blood pressure is above 150.  Your diastolic blood pressure is above 90.  You have diabetes, and your systolic blood pressure is over 140 or your diastolic blood pressure is over 90.  You have kidney disease and your blood pressure is above 140/90.  You have heart disease and your blood pressure is above 140/90. Your personal target blood pressure may vary depending on your medical conditions, your age, and other factors. HOME CARE INSTRUCTIONS    Have your blood pressure rechecked as directed by your health care provider.   Take medicines only as directed by your health care provider. Follow the directions carefully. Blood pressure medicines must be taken as prescribed. The medicine does not work as well when you skip doses. Skipping doses also puts you at risk for  problems.  Do not smoke.   Monitor your blood pressure at home as directed by your health care provider. SEEK MEDICAL CARE IF:   You think you are having a reaction to medicines taken.  You have recurrent headaches or feel dizzy.  You have swelling in your ankles.  You have trouble with your vision. SEEK IMMEDIATE MEDICAL CARE IF:  You develop a severe headache or confusion.  You have unusual weakness, numbness, or feel faint.  You have severe chest or abdominal pain.  You vomit repeatedly.  You have trouble breathing. MAKE SURE YOU:   Understand these instructions.  Will watch your condition.  Will get help right away if you are not doing well or get worse.   This information is not intended to replace advice given to you by your health care provider. Make sure you discuss any questions you have with your health care provider.   Document Released: 02/24/2005 Document Revised: 07/11/2014 Document Reviewed: 12/17/2012 Elsevier Interactive Patient Education 2016 [employer]  

## 2015-10-01 NOTE — Progress Notes (Signed)
Pre visit review using our clinic review tool, if applicable. No additional management support is needed unless otherwise documented below in the visit note. 

## 2015-10-01 NOTE — Progress Notes (Signed)
Subjective:  Patient ID: Stephen Davila, male    DOB: 1960/08/02  Age: 55 y.o. MRN: [id number]  CC: Hypertension and Hyperlipidemia   HPI Stephen Davila presents for Follow-up on hypertension and high cholesterol. He tells me his blood pressure has been well controlled with telmisartan. He rarely has episodes of dizziness. He denies headache/blurred vision/chest pain/shortness of breath/palpitations/edema/fatigue.  He is tolerating his statin therapy well with no myalgias or arthralgias. He has been working on his lifestyle modifications and taking his omega-3 fish oil to lower his triglyceride levels.  He is concerned about a lesion on his right upper arm. It is a scaly area that he scratches and excoriates and it bleeds. It has been there for several months.  Outpatient Medications Prior to Visit  Medication Sig Dispense Refill  . albuterol (VENTOLIN HFA) 108 (90 Base) MCG/ACT inhaler Inhale 2 puffs into the lungs every 6 (six) hours as needed for wheezing. 1 Inhaler 11  . aspirin EC 81 MG tablet Take 1 tablet (81 mg total) by mouth daily. 90 tablet 3  . budesonide-formoterol (SYMBICORT) 80-4.5 MCG/ACT inhaler Inhale 2 puffs into the lungs 2 (two) times daily. 1 Inhaler 11  . omega-3 acid ethyl esters (LOVAZA) 1 g capsule Take 2 capsules (2 g total) by mouth 2 (two) times daily. 120 capsule 11  . pravastatin (PRAVACHOL) 40 MG tablet Take 1 tablet (40 mg total) by mouth daily. ---patient needs office visit before any further refills 90 tablet 3  . telmisartan (MICARDIS) 40 MG tablet Take 1 tablet (40 mg total) by mouth daily. 90 tablet 1  . clobetasol ointment (TEMOVATE) [id number] % Apply 1 application topically 2 (two) times daily. 45 g 1  . pantoprazole (PROTONIX) 40 MG tablet Take 1 tablet (40 mg total) by mouth daily. 90 tablet 3  . pravastatin (PRAVACHOL) 40 MG tablet Take 1 tablet (40 mg total) by mouth daily. 90 tablet 3   No facility-administered medications prior to  visit.     ROS Review of Systems  Constitutional: Negative.  Negative for diaphoresis and fatigue.  HENT: Negative.   Eyes: Negative.  Negative for visual disturbance.  Respiratory: Negative.  Negative for cough, choking, chest tightness, shortness of breath, wheezing and stridor.   Cardiovascular: Negative.  Negative for chest pain, palpitations and leg swelling.  Gastrointestinal: Negative.  Negative for abdominal pain, constipation, diarrhea, nausea and vomiting.  Endocrine: Negative.   Genitourinary: Negative.  Negative for decreased urine volume, dysuria, frequency, hematuria and urgency.  Musculoskeletal: Negative for arthralgias, back pain, myalgias and neck pain.  Skin: Positive for color change. Negative for pallor, rash and wound.  Allergic/Immunologic: Negative.   Neurological: Positive for dizziness. Negative for tremors, weakness, light-headedness, numbness and headaches.  Hematological: Negative.  Negative for adenopathy. Does not bruise/bleed easily.  Psychiatric/Behavioral: Negative.     Objective:  BP (!) 142/72   Pulse 87   Temp 98.4 F (36.9 C) (Oral)   Resp 16   Wt 172 lb (78 kg)   SpO2 97%   BMI 23.99 kg/m   BP Readings from Last 3 Encounters:  10/01/15 (!) 142/72  07/02/15 (!) 158/80  01/16/14 134/80    Wt Readings from Last 3 Encounters:  10/01/15 172 lb (78 kg)  01/16/14 170 lb (77.1 kg)  03/30/13 170 lb 2 oz (77.2 kg)    Physical Exam  Constitutional: He is oriented to person, place, and time. No distress.  HENT:  Mouth/Throat: Oropharynx is clear and moist. No oropharyngeal exudate.  Eyes: Conjunctivae are normal. Right eye exhibits no discharge. Left eye exhibits no discharge. No scleral icterus.  Neck: Normal range of motion. Neck supple. No JVD present. No tracheal deviation present. No thyromegaly present.  Cardiovascular: Normal rate, regular rhythm, normal heart sounds and intact distal pulses.  Exam reveals no gallop and no friction  rub.   No murmur heard. Pulmonary/Chest: Effort normal and breath sounds normal. No stridor. No respiratory distress. He has no wheezes. He has no rales. He exhibits no tenderness.  Abdominal: Soft. Bowel sounds are normal. He exhibits no distension and no mass. There is no tenderness. There is no rebound and no guarding.  Musculoskeletal: Normal range of motion. He exhibits no edema, tenderness or deformity.       Arms: Lymphadenopathy:    He has no cervical adenopathy.  Neurological: He is oriented to person, place, and time.  Skin: Skin is warm and dry. No rash noted. He is not diaphoretic. No erythema. No pallor.  Vitals reviewed.   Lab Results  Component Value Date   WBC 10.9 (H) 10/01/2015   HGB 15.7 10/01/2015   HCT 45.1 10/01/2015   PLT 271.0 10/01/2015   GLUCOSE 82 10/01/2015   CHOL 224 (H) 07/02/2015   TRIG 202.0 (H) 10/01/2015   HDL 54.10 07/02/2015   LDLDIRECT 81.0 07/02/2015   ALT 22 07/02/2015   AST 23 07/02/2015   NA 136 10/01/2015   K 4.7 10/01/2015   CL 102 10/01/2015   CREATININE 0.96 10/01/2015   BUN 22 10/01/2015   CO2 26 10/01/2015   TSH 1.67 07/02/2015   PSA 0.74 07/02/2015   HGBA1C 5.3 10/01/2015    Dg Chest 2 View  Result Date: 11/29/2012 CLINICAL DATA:  Smoker, shortness of breath EXAM: CHEST  2 VIEW COMPARISON:  08/01/2010 FINDINGS: Cardiomediastinal silhouette is stable. No acute infiltrate or pleural effusion. No pulmonary edema. Bony thorax is stable. Hyperinflation again noted. IMPRESSION: No active disease. Hyperinflation again noted. Electronically Signed   By: Stephen Davila   On: 11/29/2012 14:37    Assessment & Plan:   Stephen Davila was seen today for hypertension and hyperlipidemia.  Diagnoses and all orders for this visit:  Essential hypertension- his blood pressure is well-controlled, electrolytes and renal function are stable. -     Basic metabolic panel; Future -     CBC with Differential/Platelet; Future  Hypertriglyceridemia-  improvement noted. -     Triglycerides; Future  Hyperglycemia- improvement noted -     Basic metabolic panel; Future -     Hemoglobin A1c; Future  Arm skin lesion, right -     Ambulatory referral to Dermatology   I have discontinued Mr. Stephen Davila's clobetasol ointment and pantoprazole. I am also having him maintain his pravastatin, budesonide-formoterol, albuterol, aspirin EC, telmisartan, and omega-3 acid ethyl esters.  No orders of the defined types were placed in this encounter.    Follow-up: Return in about 6 months (around 04/02/2016).  Scarlette Calico, MD

## 2015-12-28 ENCOUNTER — Other Ambulatory Visit: Payer: Self-pay | Admitting: Internal Medicine

## 2015-12-28 DIAGNOSIS — I2510 Atherosclerotic heart disease of native coronary artery without angina pectoris: Secondary | ICD-10-CM

## 2015-12-28 DIAGNOSIS — I10 Essential (primary) hypertension: Secondary | ICD-10-CM

## 2016-01-28 ENCOUNTER — Ambulatory Visit (INDEPENDENT_AMBULATORY_CARE_PROVIDER_SITE_OTHER): Payer: Self-pay

## 2016-01-28 ENCOUNTER — Ambulatory Visit (INDEPENDENT_AMBULATORY_CARE_PROVIDER_SITE_OTHER): Payer: BLUE CROSS/BLUE SHIELD | Admitting: Orthopaedic Surgery

## 2016-01-28 DIAGNOSIS — M25512 Pain in left shoulder: Secondary | ICD-10-CM

## 2016-01-28 MED ORDER — LIDOCAINE HCL 1 % IJ SOLN
3.0000 mL | INTRAMUSCULAR | Status: AC | PRN
  Administered 2016-01-28: 3 mL

## 2016-01-28 MED ORDER — METHYLPREDNISOLONE ACETATE 40 MG/ML IJ SUSP
40.0000 mg | INTRAMUSCULAR | Status: AC | PRN
  Administered 2016-01-28: 40 mg via INTRA_ARTICULAR

## 2016-01-28 NOTE — Progress Notes (Signed)
Office Visit Note   Patient: Stephen Davila           Date of Birth: [birth year]           MRN: [id number] Visit Date: 01/28/2016              Requested by: Janith Lima, MD [address] [place], [place] [address] PCP: Scarlette Calico, MD   Assessment & Plan: Visit Diagnoses:  1. Acute pain of left shoulder     Plan: At this point my working diagnosis shoulder impingement syndrome. This is his dominant shoulder and he does work as a Art gallery manager. Hopefully steroid injection in her shoulder will help calm things down. I'll see him back in a month to see how is doing overall.  Follow-Up Instructions: No Follow-up on file.   Orders:  Orders Placed This Encounter  Procedures  . XR Shoulder Left   No orders of the defined types were placed in this encounter.     Procedures: Large Joint Inj Date/Time: 01/28/2016 3:17 PM Performed by: Mcarthur Rossetti Authorized by: Mcarthur Rossetti   Location:  Shoulder Site:  L subacromial bursa Ultrasound Guidance: No   Fluoroscopic Guidance: No   Arthrogram: No   Medications:  3 mL lidocaine 1 %; 40 mg methylPREDNISolone acetate 40 MG/ML     Clinical Data: No additional findings.   Subjective: Chief Complaint  Patient presents with  . Left Shoulder - Pain    Pain in shoulder for about 6 months. States no actual injury but he was trying to put up canopys above the head around that time. Increased pain with above head ROM.    HPI Shoulder mainly hurts with overhead activities. Is been getting worse for about 6 months now. It does not hurt in his job as a Art gallery manager but her 20s at home and doing overhead activities. It is waking up at night. The pain is about a 5 out of 10. It's only activity related. He denies a numbness and tingling in his hand. Review of Systems Data for chest pain, headache, shortness of breath, fever chills and nausea and vomiting.  Objective: Vital Signs: There were no vitals taken  for this visit.  Physical Exam He is alert and oriented 3 in no acute distress Ortho Exam Examination of his left shoulder shows full range of motion. He has positive Neer and Hawkins signs. His rotator cuff feels strong to me. The shoulder appears well located. He has a negative liftoff sign. Specialty Comments:  No specialty comments available.  Imaging: No results found.   PMFS History: Patient Active Problem List   Diagnosis Date Noted  . Hyperglycemia 10/01/2015  . Arm skin lesion, right 10/01/2015  . Essential hypertension 07/02/2015  . Hypertriglyceridemia 01/19/2014  . Discoid eczema 03/30/2013  . Coronary atherosclerosis of native coronary artery 11/03/2012  . Hyperlipidemia with target LDL less than 100 11/03/2012  . Routine general medical examination at a health care facility 10/29/2011  . COPD GOLD I 06/20/2009   Past Medical History:  Diagnosis Date  . COPD (chronic obstructive pulmonary disease) (HCC)     Family History  Problem Relation Age of Onset  . Prostate cancer Father   . Heart disease Father   . Melanoma Sister   . Diabetes Neg Hx   . Stroke Neg Hx   . Kidney disease Neg Hx   . Hypertension Neg Hx   . Hyperlipidemia Neg Hx   . Colon cancer Neg  Hx   . Pancreatic cancer Neg Hx   . Stomach cancer Neg Hx     Past Surgical History:  Procedure Laterality Date  . CERVICAL SPINE SURGERY  10-01-12   5,6,7   Social History   Occupational History  . Not on file.   Social History Main Topics  . Smoking status: Former Smoker    Packs/day: 2.00    Years: 35.00    Quit date: 06/15/2009  . Smokeless tobacco: Never Used  . Alcohol use 16.8 oz/week    28 Cans of beer per week  . Drug use: No  . Sexual activity: Yes

## 2016-02-18 ENCOUNTER — Encounter (INDEPENDENT_AMBULATORY_CARE_PROVIDER_SITE_OTHER): Payer: Self-pay | Admitting: Orthopaedic Surgery

## 2016-02-25 ENCOUNTER — Ambulatory Visit (INDEPENDENT_AMBULATORY_CARE_PROVIDER_SITE_OTHER): Payer: BLUE CROSS/BLUE SHIELD | Admitting: Orthopaedic Surgery

## 2016-09-03 ENCOUNTER — Other Ambulatory Visit: Payer: Self-pay | Admitting: Internal Medicine

## 2016-09-03 DIAGNOSIS — I10 Essential (primary) hypertension: Secondary | ICD-10-CM

## 2016-09-03 DIAGNOSIS — I2510 Atherosclerotic heart disease of native coronary artery without angina pectoris: Secondary | ICD-10-CM

## 2016-09-04 ENCOUNTER — Other Ambulatory Visit: Payer: Self-pay | Admitting: Internal Medicine

## 2016-09-04 DIAGNOSIS — I2510 Atherosclerotic heart disease of native coronary artery without angina pectoris: Secondary | ICD-10-CM

## 2016-09-04 DIAGNOSIS — J449 Chronic obstructive pulmonary disease, unspecified: Secondary | ICD-10-CM

## 2016-09-04 DIAGNOSIS — E785 Hyperlipidemia, unspecified: Secondary | ICD-10-CM

## 2016-09-05 MED ORDER — BUDESONIDE-FORMOTEROL FUMARATE 80-4.5 MCG/ACT IN AERO: 2.0000 | INHALATION_SPRAY | Freq: Two times a day (BID) | RESPIRATORY_TRACT | 0 refills | Status: DC

## 2016-09-05 NOTE — Telephone Encounter (Signed)
Have patient scheduled for 7/19.  Please fill medication until then.  Patient is also requesting symbicort to be be refilled.

## 2016-09-05 NOTE — Telephone Encounter (Signed)
Sent 30 day supply =until appt../lmb 

## 2016-09-05 NOTE — Telephone Encounter (Signed)
Can you call pt and schedule for at least an annual exam. Pt has not been seen since 09/2015.  I will refill once an appt is made.

## 2016-09-09 ENCOUNTER — Other Ambulatory Visit: Payer: Self-pay | Admitting: Internal Medicine

## 2016-09-09 DIAGNOSIS — E781 Pure hyperglyceridemia: Secondary | ICD-10-CM

## 2016-09-25 ENCOUNTER — Encounter: Payer: BLUE CROSS/BLUE SHIELD | Admitting: Internal Medicine

## 2016-10-06 ENCOUNTER — Ambulatory Visit (INDEPENDENT_AMBULATORY_CARE_PROVIDER_SITE_OTHER): Payer: BLUE CROSS/BLUE SHIELD | Admitting: Internal Medicine

## 2016-10-06 ENCOUNTER — Encounter: Payer: Self-pay | Admitting: Internal Medicine

## 2016-10-06 ENCOUNTER — Other Ambulatory Visit (INDEPENDENT_AMBULATORY_CARE_PROVIDER_SITE_OTHER): Payer: BLUE CROSS/BLUE SHIELD

## 2016-10-06 VITALS — BP 116/72 | HR 97 | Temp 98.0°F | Resp 16 | Ht 71.0 in | Wt 160.0 lb

## 2016-10-06 DIAGNOSIS — I2510 Atherosclerotic heart disease of native coronary artery without angina pectoris: Secondary | ICD-10-CM

## 2016-10-06 DIAGNOSIS — Z0000 Encounter for general adult medical examination without abnormal findings: Secondary | ICD-10-CM

## 2016-10-06 DIAGNOSIS — E781 Pure hyperglyceridemia: Secondary | ICD-10-CM

## 2016-10-06 DIAGNOSIS — E785 Hyperlipidemia, unspecified: Secondary | ICD-10-CM

## 2016-10-06 DIAGNOSIS — I10 Essential (primary) hypertension: Secondary | ICD-10-CM | POA: Diagnosis not present

## 2016-10-06 LAB — COMPREHENSIVE METABOLIC PANEL
ALBUMIN: 3.7 g/dL (ref 3.5–5.2)
ALK PHOS: 59 U/L (ref 39–117)
ALT: 24 U/L (ref 0–53)
AST: 23 U/L (ref 0–37)
BILIRUBIN TOTAL: 0.6 mg/dL (ref 0.2–1.2)
BUN: 19 mg/dL (ref 6–23)
CALCIUM: 8.9 mg/dL (ref 8.4–10.5)
CO2: 28 meq/L (ref 19–32)
Chloride: 102 mEq/L (ref 96–112)
Creatinine, Ser: 1.22 mg/dL (ref 0.40–1.50)
GFR: 64.81 mL/min (ref >60.00)
Glucose, Bld: 75 mg/dL (ref 70–99)
Potassium: 4.6 mEq/L (ref 3.5–5.1)
Sodium: 137 mEq/L (ref 135–145)
TOTAL PROTEIN: 6.3 g/dL (ref 6.0–8.3)

## 2016-10-06 LAB — CBC WITH DIFFERENTIAL/PLATELET
BASOS ABS: 0.1 10*3/uL (ref 0.0–0.1)
Basophils Relative: 1 % (ref 0.0–3.0)
EOS ABS: 0.4 10*3/uL (ref 0.0–0.7)
Eosinophils Relative: 3.1 % (ref 0.0–5.0)
HEMATOCRIT: 45.9 % (ref 39.0–52.0)
Hemoglobin: 15.6 g/dL (ref 13.0–17.0)
LYMPHS PCT: 23.4 % (ref 12.0–46.0)
Lymphs Abs: 2.8 10*3/uL (ref 0.7–4.0)
MCHC: 33.9 g/dL (ref 30.0–36.0)
MCV: 99.3 fl (ref 78.0–100.0)
MONOS PCT: 11 % (ref 3.0–12.0)
Monocytes Absolute: 1.3 10*3/uL — ABNORMAL HIGH (ref 0.1–1.0)
Neutro Abs: 7.2 10*3/uL (ref 1.4–7.7)
Neutrophils Relative %: 61.5 % (ref 43.0–77.0)
Platelets: 305 10*3/uL (ref 150.0–400.0)
RBC: 4.63 Mil/uL (ref 4.22–5.81)
RDW: 12.5 % (ref 11.5–15.5)
WBC: 11.8 10*3/uL — AB (ref 4.0–10.5)

## 2016-10-06 LAB — LIPID PANEL
CHOL/HDL RATIO: 6
Cholesterol: 285 mg/dL — ABNORMAL HIGH (ref 0–200)
HDL: 44.8 mg/dL (ref >39.00)
Triglycerides: 962 mg/dL — ABNORMAL HIGH (ref 0.0–149.0)

## 2016-10-06 LAB — TSH: TSH: 1.86 u[IU]/mL (ref 0.35–4.50)

## 2016-10-06 LAB — PSA: PSA: 0.67 ng/mL (ref 0.10–4.00)

## 2016-10-06 LAB — LDL CHOLESTEROL, DIRECT: LDL DIRECT: 61 mg/dL

## 2016-10-06 NOTE — Progress Notes (Signed)
Subjective:  Patient ID: Stephen Davila, male    DOB: Nov 02, 1960  Age: 56 y.o. MRN: [id number]  CC: Hyperlipidemia; Hypertension; and Annual Exam   HPI Stephen Davila presents for a CPX.   He has had no recent episodes of chest pain or shortness of breath. He is active and his had no DOE or fatigue. He feels well today and offers no complaints.  Outpatient Medications Prior to Visit  Medication Sig Dispense Refill  . albuterol (VENTOLIN HFA) 108 (90 Base) MCG/ACT inhaler Inhale 2 puffs into the lungs every 6 (six) hours as needed for wheezing. 1 Inhaler 11  . aspirin EC 81 MG tablet Take 1 tablet (81 mg total) by mouth daily. 90 tablet 3  . budesonide-formoterol (SYMBICORT) 80-4.5 MCG/ACT inhaler Inhale 2 puffs into the lungs 2 (two) times daily. Must keep July appt for future refills 1 Inhaler 0  . pravastatin (PRAVACHOL) 40 MG tablet Take 1 tablet (40 mg total) by mouth daily. Must keep July appt for future refills 30 tablet 0  . telmisartan (MICARDIS) 40 MG tablet Take 1 tablet (40 mg total) by mouth daily. 90 tablet 1  . omega-3 acid ethyl esters (LOVAZA) 1 g capsule TAKE 2 CAPSULES (2 G TOTAL) BY MOUTH 2 (TWO) TIMES DAILY. 120 capsule 6   No facility-administered medications prior to visit.     ROS Review of Systems  Constitutional: Negative.  Negative for appetite change, chills, diaphoresis, fatigue and fever.  HENT: Negative.  Negative for sinus pressure and trouble swallowing.   Eyes: Negative.  Negative for visual disturbance.  Respiratory: Negative for apnea, cough, chest tightness, shortness of breath and wheezing.   Cardiovascular: Negative for chest pain, palpitations and leg swelling.  Gastrointestinal: Negative.  Negative for abdominal pain, blood in stool, constipation, diarrhea, nausea and vomiting.  Endocrine: Negative.   Genitourinary: Negative.  Negative for difficulty urinating, discharge, dysuria, penile pain, penile swelling, scrotal swelling  and testicular pain.  Musculoskeletal: Negative.  Negative for arthralgias, back pain, myalgias and neck pain.  Skin: Negative.  Negative for color change and rash.  Allergic/Immunologic: Negative.   Neurological: Negative.  Negative for dizziness and headaches.  Hematological: Negative for adenopathy. Does not bruise/bleed easily.  Psychiatric/Behavioral: Negative.     Objective:  BP 116/72 (BP Location: Left Arm, Patient Position: Sitting, Cuff Size: Normal)   Pulse 97   Temp 98 F (36.7 C) (Oral)   Resp 16   Ht 5\' 11"  (1.803 m)   Wt 160 lb (72.6 kg)   SpO2 98%   BMI 22.32 kg/m   BP Readings from Last 3 Encounters:  10/06/16 116/72  10/01/15 (!) 142/72  07/02/15 (!) 158/80    Wt Readings from Last 3 Encounters:  10/06/16 160 lb (72.6 kg)  10/01/15 172 lb (78 kg)  01/16/14 170 lb (77.1 kg)    Physical Exam  Constitutional: He is oriented to person, place, and time. No distress.  HENT:  Mouth/Throat: Oropharynx is clear and moist.  Eyes: Conjunctivae are normal. Right eye exhibits no discharge. Left eye exhibits no discharge. No scleral icterus.  Neck: Normal range of motion. Neck supple. No JVD present. No thyromegaly present.  Cardiovascular: Normal rate, regular rhythm, normal heart sounds and intact distal pulses.  Exam reveals no gallop and no friction rub.   No murmur heard. EKG- Sinus  Rhythm  -Prominent R(V1) and left axis -nonspecific  -Seen with pulmonary disease -possible anterior fascicular block.   ABNORMAL - no significant change compared to  the prior EKG  Pulmonary/Chest: Effort normal and breath sounds normal. No respiratory distress. He has no wheezes. He has no rales. He exhibits no tenderness.  Abdominal: Soft. Bowel sounds are normal. He exhibits no distension and no mass. There is no tenderness. There is no rebound and no guarding. Hernia confirmed negative in the right inguinal area and confirmed negative in the left inguinal area.  Genitourinary:  Testes normal and penis normal. Rectal exam shows internal hemorrhoid. Rectal exam shows no external hemorrhoid, no fissure, no mass, no tenderness and guaiac negative stool. Prostate is enlarged (1+ smooth symm BPH). Prostate is not tender. Right testis shows no mass, no swelling and no tenderness. Right testis is descended. Left testis shows no mass, no swelling and no tenderness. Left testis is descended. Circumcised. No penile erythema or penile tenderness. No discharge found.  Musculoskeletal: Normal range of motion. He exhibits no edema, tenderness or deformity.  Lymphadenopathy:    He has no cervical adenopathy.       Right: No inguinal adenopathy present.       Left: No inguinal adenopathy present.  Neurological: He is alert and oriented to person, place, and time.  Skin: Skin is warm and dry. No rash noted. He is not diaphoretic. No erythema. No pallor.  Psychiatric: He has a normal mood and affect. His behavior is normal. Judgment and thought content normal.  Vitals reviewed.   Lab Results  Component Value Date   WBC 11.8 (H) 10/06/2016   HGB 15.6 10/06/2016   HCT 45.9 10/06/2016   PLT 305.0 10/06/2016   GLUCOSE 75 10/06/2016   CHOL 285 (H) 10/06/2016   TRIG (H) 10/06/2016    962.0 Triglyceride is over 400; calculations on Lipids are invalid.   HDL 44.80 10/06/2016   LDLDIRECT 61.0 10/06/2016   ALT 24 10/06/2016   AST 23 10/06/2016   NA 137 10/06/2016   K 4.6 10/06/2016   CL 102 10/06/2016   CREATININE 1.22 10/06/2016   BUN 19 10/06/2016   CO2 28 10/06/2016   TSH 1.86 10/06/2016   PSA 0.67 10/06/2016   HGBA1C 5.3 10/01/2015    Dg Chest 2 View  Result Date: 11/29/2012 CLINICAL DATA:  Smoker, shortness of breath EXAM: CHEST  2 VIEW COMPARISON:  08/01/2010 FINDINGS: Cardiomediastinal silhouette is stable. No acute infiltrate or pleural effusion. No pulmonary edema. Bony thorax is stable. Hyperinflation again noted. IMPRESSION: No active disease. Hyperinflation again  noted. Electronically Signed   By: Lahoma Crocker   On: 11/29/2012 14:37    Assessment & Plan:   Kairen was seen today for hyperlipidemia, hypertension and annual exam.  Diagnoses and all orders for this visit:  Atherosclerosis of native coronary artery of native heart without angina pectoris- he has had no recent episodes of angina and his EKG is negative for any signs of ischemia. Will continue risk factor modification with controlling his lipids, taking an aspirin a day, and taking an ARB. -     EKG 12-Lead  Essential hypertension- his blood pressure is well-controlled, electrolytes and renal function are stable. -     Comprehensive metabolic panel; Future -     CBC with Differential/Platelet; Future  Hyperlipidemia with target LDL less than 100- he has achieved his LDL goal and is doing well on the statin. -     TSH; Future  Hypertriglyceridemia- his triglycerides are elevated at 962. I have asked him to restart the omega-3 fish oils. -     omega-3 acid ethyl esters (  LOVAZA) 1 g capsule; Take 2 capsules (2 g total) by mouth 2 (two) times daily.  Routine general medical examination at a health care facility- exam completed, labs reviewed, vaccines reviewed, screening for colon cancer is up-to-date, patient education material was given. -     Lipid panel; Future -     PSA; Future   I am having Mr. Haverstock maintain his albuterol, aspirin EC, telmisartan, pravastatin, budesonide-formoterol, and omega-3 acid ethyl esters.  Meds ordered this encounter  Medications  . omega-3 acid ethyl esters (LOVAZA) 1 g capsule    Sig: Take 2 capsules (2 g total) by mouth 2 (two) times daily.    Dispense:  360 capsule    Refill:  3     Follow-up: Return in about 6 months (around 04/08/2017).  Scarlette Calico, MD

## 2016-10-06 NOTE — Patient Instructions (Signed)

## 2016-10-07 ENCOUNTER — Other Ambulatory Visit: Payer: Self-pay | Admitting: Internal Medicine

## 2016-10-07 ENCOUNTER — Encounter: Payer: Self-pay | Admitting: Internal Medicine

## 2016-10-07 DIAGNOSIS — E781 Pure hyperglyceridemia: Secondary | ICD-10-CM

## 2016-10-07 MED ORDER — FENOFIBRATE MICRONIZED 134 MG PO CAPS: 134.0000 mg | ORAL_CAPSULE | Freq: Every day | ORAL | 0 refills | Status: DC

## 2016-10-07 MED ORDER — OMEGA-3-ACID ETHYL ESTERS 1 G PO CAPS: 2.0000 g | ORAL_CAPSULE | Freq: Two times a day (BID) | ORAL | 3 refills | Status: DC

## 2016-10-27 ENCOUNTER — Other Ambulatory Visit: Payer: Self-pay | Admitting: Internal Medicine

## 2016-10-27 DIAGNOSIS — E785 Hyperlipidemia, unspecified: Secondary | ICD-10-CM

## 2016-10-27 DIAGNOSIS — I2510 Atherosclerotic heart disease of native coronary artery without angina pectoris: Secondary | ICD-10-CM

## 2016-10-28 ENCOUNTER — Other Ambulatory Visit: Payer: Self-pay | Admitting: Internal Medicine

## 2016-10-28 DIAGNOSIS — J449 Chronic obstructive pulmonary disease, unspecified: Secondary | ICD-10-CM

## 2016-10-29 ENCOUNTER — Other Ambulatory Visit: Payer: Self-pay | Admitting: Internal Medicine

## 2016-10-29 DIAGNOSIS — J449 Chronic obstructive pulmonary disease, unspecified: Secondary | ICD-10-CM

## 2016-11-24 ENCOUNTER — Ambulatory Visit (INDEPENDENT_AMBULATORY_CARE_PROVIDER_SITE_OTHER): Payer: BLUE CROSS/BLUE SHIELD | Admitting: Internal Medicine

## 2016-11-24 ENCOUNTER — Encounter: Payer: Self-pay | Admitting: Internal Medicine

## 2016-11-24 ENCOUNTER — Other Ambulatory Visit (INDEPENDENT_AMBULATORY_CARE_PROVIDER_SITE_OTHER): Payer: BLUE CROSS/BLUE SHIELD

## 2016-11-24 VITALS — BP 140/90 | HR 88 | Temp 98.9°F | Ht 71.0 in | Wt 160.0 lb

## 2016-11-24 DIAGNOSIS — E785 Hyperlipidemia, unspecified: Secondary | ICD-10-CM

## 2016-11-24 DIAGNOSIS — E781 Pure hyperglyceridemia: Secondary | ICD-10-CM

## 2016-11-24 LAB — LIPID PANEL
Cholesterol: 221 mg/dL — ABNORMAL HIGH (ref 0–200)
HDL: 74.8 mg/dL (ref >39.00)
LDL Cholesterol: 107 mg/dL — ABNORMAL HIGH (ref 0–99)
NONHDL: 145.73
Total CHOL/HDL Ratio: 3
Triglycerides: 195 mg/dL — ABNORMAL HIGH (ref 0.0–149.0)
VLDL: 39 mg/dL (ref 0.0–40.0)

## 2016-11-24 NOTE — Progress Notes (Signed)
Subjective:  Patient ID: Stephen Davila, male    DOB: [birth year]  Age: 56 y.o. MRN: [id number]  CC: Hyperlipidemia   HPI Stephen Davila presents for f/up - He feels well and offers no complaints. He is taking both the fenofibrate and Lovaza. He continues to drink about 5 or 6 peers per day. He sees no problems with this and does not want to stop drinking beer. He has had no recent episodes of abdominal pain, nausea, or vomiting.  Outpatient Medications Prior to Visit  Medication Sig Dispense Refill  . albuterol (PROVENTIL HFA;VENTOLIN HFA) 108 (90 Base) MCG/ACT inhaler Inhale 2 puffs into the lungs every 6 (six) hours as needed for wheezing. 18 Inhaler 2  . aspirin EC 81 MG tablet Take 1 tablet (81 mg total) by mouth daily. 90 tablet 3  . fenofibrate micronized (LOFIBRA) 134 MG capsule Take 1 capsule (134 mg total) by mouth daily before breakfast. 90 capsule 0  . omega-3 acid ethyl esters (LOVAZA) 1 g capsule Take 2 capsules (2 g total) by mouth 2 (two) times daily. 360 capsule 3  . pravastatin (PRAVACHOL) 40 MG tablet TAKE 1 TABLET BY MOUTH EVERY DAY 90 tablet 1  . SYMBICORT 80-4.5 MCG/ACT inhaler INHALE 2 PUFFS INTO THE LUNGS TWICE DAILY. *MUST KEEP JULY APPOINTMENT FOR FUTURE REFILLS* 10.2 Inhaler 5  . telmisartan (MICARDIS) 40 MG tablet Take 1 tablet (40 mg total) by mouth daily. 90 tablet 1   No facility-administered medications prior to visit.     ROS Review of Systems  Constitutional: Negative for appetite change, diaphoresis, fatigue and unexpected weight change.  HENT: Negative.  Negative for trouble swallowing.   Eyes: Negative.   Respiratory: Negative.  Negative for cough, chest tightness, shortness of breath and wheezing.   Cardiovascular: Negative for chest pain, palpitations and leg swelling.  Gastrointestinal: Negative.  Negative for abdominal pain, constipation, diarrhea, nausea and vomiting.  Endocrine: Negative.   Genitourinary: Negative.  Negative for  difficulty urinating.  Musculoskeletal: Negative.  Negative for back pain, myalgias and neck pain.  Skin: Negative.   Allergic/Immunologic: Negative.   Neurological: Negative.  Negative for dizziness, weakness and light-headedness.  Hematological: Negative for adenopathy. Does not bruise/bleed easily.  Psychiatric/Behavioral: Negative.  Negative for dysphoric mood and suicidal ideas. The patient is not nervous/anxious.     Objective:  BP 140/90 (BP Location: Left Arm, Patient Position: Sitting, Cuff Size: Normal)   Pulse 88   Temp 98.9 F (37.2 C) (Oral)   Ht 5\' 11"  (1.803 m)   Wt 160 lb (72.6 kg)   SpO2 99%   BMI 22.32 kg/m   BP Readings from Last 3 Encounters:  11/24/16 140/90  10/06/16 116/72  10/01/15 (!) 142/72    Wt Readings from Last 3 Encounters:  11/24/16 160 lb (72.6 kg)  10/06/16 160 lb (72.6 kg)  10/01/15 172 lb (78 kg)    Physical Exam  Constitutional: He is oriented to person, place, and time. No distress.  HENT:  Mouth/Throat: Oropharynx is clear and moist. No oropharyngeal exudate.  Eyes: Conjunctivae are normal. Right eye exhibits no discharge. Left eye exhibits no discharge. No scleral icterus.  Neck: Normal range of motion. Neck supple. No JVD present. No thyromegaly present.  Cardiovascular: Normal rate, regular rhythm and intact distal pulses.  Exam reveals no gallop and no friction rub.   No murmur heard. Pulmonary/Chest: Effort normal and breath sounds normal. No respiratory distress. He has no wheezes. He has no rales. He exhibits no tenderness.  Abdominal: Soft. Bowel sounds are normal. He exhibits no distension and no mass. There is no tenderness. There is no rebound and no guarding.  Musculoskeletal: Normal range of motion. He exhibits no edema, tenderness or deformity.  Lymphadenopathy:    He has no cervical adenopathy.  Neurological: He is alert and oriented to person, place, and time.  Skin: Skin is warm and dry. No rash noted. He is not  diaphoretic. No erythema. No pallor.  Vitals reviewed.   Lab Results  Component Value Date   WBC 11.8 (H) 10/06/2016   HGB 15.6 10/06/2016   HCT 45.9 10/06/2016   PLT 305.0 10/06/2016   GLUCOSE 75 10/06/2016   CHOL 221 (H) 11/24/2016   TRIG 195.0 (H) 11/24/2016   HDL 74.80 11/24/2016   LDLDIRECT 61.0 10/06/2016   LDLCALC 107 (H) 11/24/2016   ALT 24 10/06/2016   AST 23 10/06/2016   NA 137 10/06/2016   K 4.6 10/06/2016   CL 102 10/06/2016   CREATININE 1.22 10/06/2016   BUN 19 10/06/2016   CO2 28 10/06/2016   TSH 1.86 10/06/2016   PSA 0.67 10/06/2016   HGBA1C 5.3 10/01/2015    Dg Chest 2 View  Result Date: 11/29/2012 CLINICAL DATA:  Smoker, shortness of breath EXAM: CHEST  2 VIEW COMPARISON:  08/01/2010 FINDINGS: Cardiomediastinal silhouette is stable. No acute infiltrate or pleural effusion. No pulmonary edema. Bony thorax is stable. Hyperinflation again noted. IMPRESSION: No active disease. Hyperinflation again noted. Electronically Signed   By: Lahoma Crocker   On: 11/29/2012 14:37    Assessment & Plan:   Stephen Davila was seen today for hyperlipidemia.  Diagnoses and all orders for this visit:  Hypertriglyceridemia- his triglycerides have improved down to 195. I've asked him to continue taking the fenofibrate and Lovaza. I've also encouraged him to limit his alcohol intake to 2 or 3 beverages a day. I don't think he is interested in that recommendation. -     Cancel: Triglycerides; Future -     Lipid panel; Future  Hyperlipidemia with target LDL less than 100- his ASCVD risk score is only 8% so I do not recommend that he start taking a statin for CV risk reduction. -     Lipid panel; Future   I am having Stephen Davila maintain his aspirin EC, telmisartan, omega-3 acid ethyl esters, fenofibrate micronized, pravastatin, SYMBICORT, and albuterol.  No orders of the defined types were placed in this encounter.    Follow-up: Return in about 6 months (around  05/24/2017).  Scarlette Calico, MD

## 2016-11-24 NOTE — Patient Instructions (Signed)

## 2017-01-03 ENCOUNTER — Other Ambulatory Visit: Payer: Self-pay | Admitting: Internal Medicine

## 2017-01-03 DIAGNOSIS — E781 Pure hyperglyceridemia: Secondary | ICD-10-CM

## 2017-03-09 ENCOUNTER — Other Ambulatory Visit: Payer: Self-pay | Admitting: Internal Medicine

## 2017-03-09 DIAGNOSIS — I10 Essential (primary) hypertension: Secondary | ICD-10-CM

## 2017-03-09 DIAGNOSIS — I2510 Atherosclerotic heart disease of native coronary artery without angina pectoris: Secondary | ICD-10-CM

## 2017-06-03 ENCOUNTER — Other Ambulatory Visit: Payer: Self-pay | Admitting: Internal Medicine

## 2017-06-03 DIAGNOSIS — I2510 Atherosclerotic heart disease of native coronary artery without angina pectoris: Secondary | ICD-10-CM

## 2017-06-03 DIAGNOSIS — E785 Hyperlipidemia, unspecified: Secondary | ICD-10-CM

## 2017-07-04 ENCOUNTER — Other Ambulatory Visit: Payer: Self-pay | Admitting: Internal Medicine

## 2017-07-04 DIAGNOSIS — J449 Chronic obstructive pulmonary disease, unspecified: Secondary | ICD-10-CM

## 2017-09-03 ENCOUNTER — Other Ambulatory Visit: Payer: Self-pay | Admitting: Internal Medicine

## 2017-09-03 DIAGNOSIS — E781 Pure hyperglyceridemia: Secondary | ICD-10-CM

## 2017-10-05 ENCOUNTER — Other Ambulatory Visit: Payer: Self-pay | Admitting: Internal Medicine

## 2017-10-05 DIAGNOSIS — E781 Pure hyperglyceridemia: Secondary | ICD-10-CM

## 2017-10-05 DIAGNOSIS — E785 Hyperlipidemia, unspecified: Secondary | ICD-10-CM

## 2017-10-05 DIAGNOSIS — I10 Essential (primary) hypertension: Secondary | ICD-10-CM

## 2017-10-05 DIAGNOSIS — I2510 Atherosclerotic heart disease of native coronary artery without angina pectoris: Secondary | ICD-10-CM

## 2017-10-19 ENCOUNTER — Telehealth: Payer: Self-pay | Admitting: Internal Medicine

## 2017-10-19 ENCOUNTER — Other Ambulatory Visit: Payer: Self-pay | Admitting: Internal Medicine

## 2017-10-19 ENCOUNTER — Encounter: Payer: Self-pay | Admitting: Internal Medicine

## 2017-10-19 DIAGNOSIS — I2510 Atherosclerotic heart disease of native coronary artery without angina pectoris: Secondary | ICD-10-CM

## 2017-10-19 DIAGNOSIS — I10 Essential (primary) hypertension: Secondary | ICD-10-CM

## 2017-10-19 DIAGNOSIS — E781 Pure hyperglyceridemia: Secondary | ICD-10-CM

## 2017-10-19 DIAGNOSIS — E785 Hyperlipidemia, unspecified: Secondary | ICD-10-CM

## 2017-10-19 MED ORDER — PRAVASTATIN SODIUM 40 MG PO TABS: 40.0000 mg | ORAL_TABLET | Freq: Every day | ORAL | 0 refills | Status: DC

## 2017-10-19 NOTE — Telephone Encounter (Signed)
I am not sure who can help. PCP is okay with pt switching. I am not sure who is accepting new patients at [facility]. Can you help?

## 2017-10-19 NOTE — Telephone Encounter (Signed)
Patient is requesting to transfer to [place].  Can you please set patient up with a provider taking on patients at your location? Thanks!

## 2017-10-20 NOTE — Telephone Encounter (Signed)
LMTCB - need to know which provider he would like to see. TOC request needs to be completed with the provider he chooses.  Per Tammy @ [facility] Ronnald Ramp has already approved for pt to transfer.

## 2017-10-21 MED ORDER — FENOFIBRATE MICRONIZED 134 MG PO CAPS: 134.0000 mg | ORAL_CAPSULE | Freq: Every day | ORAL | 0 refills | Status: DC

## 2017-10-21 MED ORDER — TELMISARTAN 40 MG PO TABS: 40.0000 mg | ORAL_TABLET | Freq: Every day | ORAL | 0 refills | Status: DC

## 2017-10-21 NOTE — Telephone Encounter (Signed)
Let me know if you are not comfortable sending in a short rx of pt meds. 30 day supply sent of the telmisartan and fenofibrate.

## 2017-10-26 NOTE — Telephone Encounter (Signed)
Pt scheduled with Tommi Rumps.

## 2017-11-23 ENCOUNTER — Other Ambulatory Visit: Payer: Self-pay | Admitting: Internal Medicine

## 2017-11-23 DIAGNOSIS — E781 Pure hyperglyceridemia: Secondary | ICD-10-CM

## 2017-11-23 DIAGNOSIS — I10 Essential (primary) hypertension: Secondary | ICD-10-CM

## 2017-11-23 DIAGNOSIS — I2510 Atherosclerotic heart disease of native coronary artery without angina pectoris: Secondary | ICD-10-CM

## 2017-11-25 ENCOUNTER — Ambulatory Visit: Payer: BLUE CROSS/BLUE SHIELD | Admitting: Adult Health

## 2017-11-25 ENCOUNTER — Encounter: Payer: Self-pay | Admitting: Adult Health

## 2017-11-25 VITALS — BP 126/70 | Temp 98.1°F | Wt 163.0 lb

## 2017-11-25 DIAGNOSIS — I2510 Atherosclerotic heart disease of native coronary artery without angina pectoris: Secondary | ICD-10-CM | POA: Diagnosis not present

## 2017-11-25 DIAGNOSIS — Z125 Encounter for screening for malignant neoplasm of prostate: Secondary | ICD-10-CM

## 2017-11-25 DIAGNOSIS — Z0000 Encounter for general adult medical examination without abnormal findings: Secondary | ICD-10-CM | POA: Diagnosis not present

## 2017-11-25 DIAGNOSIS — I10 Essential (primary) hypertension: Secondary | ICD-10-CM

## 2017-11-25 DIAGNOSIS — E781 Pure hyperglyceridemia: Secondary | ICD-10-CM | POA: Diagnosis not present

## 2017-11-25 DIAGNOSIS — E785 Hyperlipidemia, unspecified: Secondary | ICD-10-CM | POA: Diagnosis not present

## 2017-11-25 DIAGNOSIS — Z789 Other specified health status: Secondary | ICD-10-CM

## 2017-11-25 DIAGNOSIS — Z7289 Other problems related to lifestyle: Secondary | ICD-10-CM

## 2017-11-25 LAB — CBC WITH DIFFERENTIAL/PLATELET
Basophils Absolute: 0.1 10*3/uL (ref 0.0–0.1)
Basophils Relative: 1 % (ref 0.0–3.0)
EOS ABS: 0.3 10*3/uL (ref 0.0–0.7)
EOS PCT: 4.5 % (ref 0.0–5.0)
HEMATOCRIT: 43.8 % (ref 39.0–52.0)
Hemoglobin: 15.2 g/dL (ref 13.0–17.0)
LYMPHS PCT: 28.7 % (ref 12.0–46.0)
Lymphs Abs: 2.1 10*3/uL (ref 0.7–4.0)
MCHC: 34.6 g/dL (ref 30.0–36.0)
MCV: 98.3 fl (ref 78.0–100.0)
Monocytes Absolute: 0.8 10*3/uL (ref 0.1–1.0)
Monocytes Relative: 10.8 % (ref 3.0–12.0)
NEUTROS ABS: 4.1 10*3/uL (ref 1.4–7.7)
Neutrophils Relative %: 55 % (ref 43.0–77.0)
PLATELETS: 290 10*3/uL (ref 150.0–400.0)
RBC: 4.46 Mil/uL (ref 4.22–5.81)
RDW: 12.7 % (ref 11.5–15.5)
WBC: 7.4 10*3/uL (ref 4.0–10.5)

## 2017-11-25 LAB — PSA: PSA: 1.74 ng/mL (ref 0.10–4.00)

## 2017-11-25 LAB — BASIC METABOLIC PANEL
BUN: 22 mg/dL (ref 6–23)
CALCIUM: 9.1 mg/dL (ref 8.4–10.5)
CO2: 28 mEq/L (ref 19–32)
CREATININE: 1.02 mg/dL (ref 0.40–1.50)
Chloride: 103 mEq/L (ref 96–112)
GFR: 79.38 mL/min (ref >60.00)
Glucose, Bld: 93 mg/dL (ref 70–99)
POTASSIUM: 4.9 meq/L (ref 3.5–5.1)
Sodium: 137 mEq/L (ref 135–145)

## 2017-11-25 LAB — HEPATIC FUNCTION PANEL
ALBUMIN: 3.9 g/dL (ref 3.5–5.2)
ALT: 19 U/L (ref 0–53)
AST: 22 U/L (ref 0–37)
Alkaline Phosphatase: 46 U/L (ref 39–117)
BILIRUBIN TOTAL: 0.6 mg/dL (ref 0.2–1.2)
Bilirubin, Direct: 0.1 mg/dL (ref 0.0–0.3)
Total Protein: 6.6 g/dL (ref 6.0–8.3)

## 2017-11-25 LAB — LIPID PANEL
Cholesterol: 227 mg/dL — ABNORMAL HIGH (ref 0–200)
HDL: 46.6 mg/dL (ref >39.00)
Total CHOL/HDL Ratio: 5
Triglycerides: 575 mg/dL — ABNORMAL HIGH (ref 0.0–149.0)

## 2017-11-25 LAB — TSH: TSH: 1.26 u[IU]/mL (ref 0.35–4.50)

## 2017-11-25 LAB — LDL CHOLESTEROL, DIRECT: Direct LDL: 87 mg/dL

## 2017-11-25 MED ORDER — FENOFIBRATE MICRONIZED 134 MG PO CAPS: 134.0000 mg | ORAL_CAPSULE | Freq: Every day | ORAL | 3 refills | Status: DC

## 2017-11-25 MED ORDER — TELMISARTAN 40 MG PO TABS: 40.0000 mg | ORAL_TABLET | Freq: Every day | ORAL | 3 refills | Status: DC

## 2017-11-25 MED ORDER — OMEGA-3-ACID ETHYL ESTERS 1 G PO CAPS: 2.0000 g | ORAL_CAPSULE | Freq: Two times a day (BID) | ORAL | 3 refills | Status: DC

## 2017-11-25 MED ORDER — PRAVASTATIN SODIUM 40 MG PO TABS: 40.0000 mg | ORAL_TABLET | Freq: Every day | ORAL | 3 refills | Status: DC

## 2017-11-25 NOTE — Progress Notes (Signed)
Patient presents to clinic today to establish care. He is a pleasant 57 year old male who  has a past medical history of COPD (chronic obstructive pulmonary disease) ([place]), Hyperlipidemia, and Hypertension.  He is a former patient of Dr. Ronnald Ramp, who is transferring care  Acute Concerns: Establish Care  Chronic Issues: COPD - Uses Symbicrot daily and Albuterol PRN   Hypertension - takes Micardis 40 mg. He rarely has episodes of dizziness.  He denies any headaches, chest pain, shortness of breath, blurred vision, palpitations, fatigue BP Readings from Last 3 Encounters:  11/25/17 126/70  11/24/16 140/90  10/06/16 116/72   Hyperlipidemia -takes pravastatin, Lovaza, and fenofibrate.  Tolerate statin therapy well, complains of no myalgia or arthralgias Lab Results  Component Value Date   CHOL 221 (H) 11/24/2016   HDL 74.80 11/24/2016   LDLCALC 107 (H) 11/24/2016   LDLDIRECT 61.0 10/06/2016   TRIG 195.0 (H) 11/24/2016   CHOLHDL 3 11/24/2016   Alcohol Use - reports that he drinking " acouple of 12 packs a week." He has been doing this for an extended period of time. Does not want to cut back on drinking.   Health Maintenance: Dental -- Routine Care  Vision -- Routine Care  Immunizations -- Refuses flu shot.  Colonoscopy -- UTD ( due in 2024)  Diet: Tries to eat healthy  Exercise: Does not exercise on a regular basis.    Past Medical History:  Diagnosis Date  . COPD (chronic obstructive pulmonary disease) ([place])   . Hyperlipidemia   . Hypertension     Past Surgical History:  Procedure Laterality Date  . CERVICAL SPINE SURGERY  10-01-12   5,6,7    Current Outpatient Medications on File Prior to Visit  Medication Sig Dispense Refill  . albuterol (PROVENTIL HFA;VENTOLIN HFA) 108 (90 Base) MCG/ACT inhaler Inhale 2 puffs into the lungs every 6 (six) hours as needed for wheezing. 18 Inhaler 2  . aspirin EC 81 MG tablet Take 1 tablet (81 mg total) by mouth daily. 90 tablet 3    . fenofibrate micronized (LOFIBRA) 134 MG capsule Take 1 capsule (134 mg total) by mouth daily before breakfast. 30 capsule 0  . pravastatin (PRAVACHOL) 40 MG tablet Take 1 tablet (40 mg total) by mouth daily. 90 tablet 0  . SYMBICORT 80-4.5 MCG/ACT inhaler INHALE 2 PUFFS INTO THE LUNGS TWICE DAILY 10.2 Inhaler 5  . telmisartan (MICARDIS) 40 MG tablet Take 1 tablet (40 mg total) by mouth daily. 30 tablet 0   No current facility-administered medications on file prior to visit.     No Active Allergies  Family History  Problem Relation Age of Onset  . Prostate cancer Father   . Heart disease Father   . Melanoma Sister   . Diabetes Neg Hx   . Stroke Neg Hx   . Kidney disease Neg Hx   . Hypertension Neg Hx   . Hyperlipidemia Neg Hx   . Colon cancer Neg Hx   . Pancreatic cancer Neg Hx   . Stomach cancer Neg Hx     Social History   Socioeconomic History  . Marital status: Married    Spouse name: Not on file  . Number of children: Not on file  . Years of education: Not on file  . Highest education level: Not on file  Occupational History  . Not on file  Social Needs  . Financial resource strain: Not on file  . Food insecurity:    Worry: Not  on file    Inability: Not on file  . Transportation needs:    Medical: Not on file    Non-medical: Not on file  Tobacco Use  . Smoking status: Former Smoker    Packs/day: 2.00    Years: 35.00    Pack years: 70.00    Last attempt to quit: 06/15/2009    Years since quitting: 8.4  . Smokeless tobacco: Never Used  Substance and Sexual Activity  . Alcohol use: Yes    Alcohol/week: 42.0 standard drinks    Types: 42 Cans of beer per week  . Drug use: No  . Sexual activity: Yes  Lifestyle  . Physical activity:    Days per week: Not on file    Minutes per session: Not on file  . Stress: Not on file  Relationships  . Social connections:    Talks on phone: Not on file    Gets together: Not on file    Attends religious service: Not on  file    Active member of club or organization: Not on file    Attends meetings of clubs or organizations: Not on file    Relationship status: Not on file  . Intimate partner violence:    Fear of current or ex partner: Not on file    Emotionally abused: Not on file    Physically abused: Not on file    Forced sexual activity: Not on file  Other Topics Concern  . Not on file  Social History Narrative  . Not on file    Review of Systems  Constitutional: Negative.   HENT: Negative.   Eyes: Negative.   Respiratory: Negative.   Cardiovascular: Negative.   Gastrointestinal: Negative.   Genitourinary: Negative.   Musculoskeletal: Negative.   Skin: Negative.   Neurological: Negative.   Endo/Heme/Allergies: Negative.   Psychiatric/Behavioral: Negative.   All other systems reviewed and are negative.     BP 126/70   Temp 98.1 F (36.7 C)   Wt 163 lb (73.9 kg)   BMI 22.73 kg/m   Physical Exam  Constitutional: He is oriented to person, place, and time. He appears well-developed and well-nourished. No distress.  HENT:  Head: Normocephalic and atraumatic.  Right Ear: External ear normal.  Left Ear: External ear normal.  Nose: Nose normal.  Mouth/Throat: Oropharynx is clear and moist. No oropharyngeal exudate.  Eyes: Pupils are equal, round, and reactive to light. Conjunctivae and EOM are normal. Right eye exhibits no discharge. Left eye exhibits no discharge. No scleral icterus.  Neck: Normal range of motion. Neck supple. No JVD present. No tracheal deviation present. No thyromegaly present.  Cardiovascular: Normal rate, regular rhythm, normal heart sounds and intact distal pulses. Exam reveals no gallop and no friction rub.  No murmur heard. Pulmonary/Chest: Effort normal and breath sounds normal. No stridor. No respiratory distress. He has no wheezes. He has no rhonchi. He has no rales. He exhibits no tenderness.  Abdominal: Soft. Bowel sounds are normal. He exhibits no  distension, no pulsatile liver, no fluid wave, no ascites, no pulsatile midline mass and no mass. There is no tenderness. There is no rebound and no guarding. A hernia is present. Hernia confirmed positive in the ventral area.  Musculoskeletal: Normal range of motion. He exhibits no edema, tenderness or deformity.  Lymphadenopathy:    He has no cervical adenopathy.  Neurological: He is alert and oriented to person, place, and time. He displays normal reflexes. No cranial nerve deficit or sensory deficit.  He exhibits normal muscle tone. Coordination normal.  Skin: Skin is warm and dry. Capillary refill takes less than 2 seconds. No rash noted. He is not diaphoretic. No erythema. No pallor.  Psychiatric: He has a normal mood and affect. His behavior is normal. Judgment and thought content normal.  Nursing note and vitals reviewed.  Assessment/Plan: 1. Routine general medical examination at a health care facility - Encouraged to cut back on alcohol consumption  - Encouraged heart healthy diet and exercise  - Follow up in one year or sooner if needed - CBC with Differential/Platelet - Basic metabolic panel - Hepatic function panel - Lipid panel - TSH - PSA  2. Hypertriglyceridemia  - fenofibrate micronized (LOFIBRA) 134 MG capsule; Take 1 capsule (134 mg total) by mouth daily before breakfast.  Dispense: 90 capsule; Refill: 3 - omega-3 acid ethyl esters (LOVAZA) 1 g capsule; Take 2 capsules (2 g total) by mouth 2 (two) times daily.  Dispense: 360 capsule; Refill: 3 - US Abdomen Limited RUQ; Future  3. Hyperlipidemia with target LDL less than 100  - pravastatin (PRAVACHOL) 40 MG tablet; Take 1 tablet (40 mg total) by mouth daily.  Dispense: 90 tablet; Refill: 3 - US Abdomen Limited RUQ; Future  4. Atherosclerosis of native coronary artery of native heart without angina pectoris  - pravastatin (PRAVACHOL) 40 MG tablet; Take 1 tablet (40 mg total) by mouth daily.  Dispense: 90 tablet;  Refill: 3 - telmisartan (MICARDIS) 40 MG tablet; Take 1 tablet (40 mg total) by mouth daily.  Dispense: 90 tablet; Refill: 3  5. Essential hypertension - At goal. No change in medications  - telmisartan (MICARDIS) 40 MG tablet; Take 1 tablet (40 mg total) by mouth daily.  Dispense: 90 tablet; Refill: 3  6. Alcohol use  - Jaundice not noted.  - Encouraged to cut back on alcohol.  - US Abdomen Limited RUQ; Future   Dorothyann Peng, NP

## 2017-11-26 ENCOUNTER — Other Ambulatory Visit: Payer: Self-pay | Admitting: Adult Health

## 2017-11-26 DIAGNOSIS — E781 Pure hyperglyceridemia: Secondary | ICD-10-CM

## 2018-02-01 ENCOUNTER — Other Ambulatory Visit: Payer: Self-pay | Admitting: Internal Medicine

## 2018-02-01 DIAGNOSIS — J449 Chronic obstructive pulmonary disease, unspecified: Secondary | ICD-10-CM

## 2018-02-18 ENCOUNTER — Encounter: Payer: Self-pay | Admitting: Cardiovascular Disease

## 2018-02-18 ENCOUNTER — Ambulatory Visit: Payer: BLUE CROSS/BLUE SHIELD | Admitting: Cardiovascular Disease

## 2018-02-18 VITALS — BP 132/66 | HR 83 | Ht 71.0 in | Wt 170.0 lb

## 2018-02-18 DIAGNOSIS — I2510 Atherosclerotic heart disease of native coronary artery without angina pectoris: Secondary | ICD-10-CM

## 2018-02-18 DIAGNOSIS — E781 Pure hyperglyceridemia: Secondary | ICD-10-CM | POA: Diagnosis not present

## 2018-02-18 DIAGNOSIS — Z79899 Other long term (current) drug therapy: Secondary | ICD-10-CM

## 2018-02-18 DIAGNOSIS — E785 Hyperlipidemia, unspecified: Secondary | ICD-10-CM | POA: Diagnosis not present

## 2018-02-18 DIAGNOSIS — Z7289 Other problems related to lifestyle: Secondary | ICD-10-CM

## 2018-02-18 DIAGNOSIS — Z789 Other specified health status: Secondary | ICD-10-CM

## 2018-02-18 MED ORDER — ICOSAPENT ETHYL 1 G PO CAPS: 2.0000 g | ORAL_CAPSULE | Freq: Two times a day (BID) | ORAL | 5 refills | Status: DC

## 2018-02-18 NOTE — Patient Instructions (Signed)
Medication Instructions:  Dr Sallyanne Kuster has recommended making the following medication changes: 1. STOP Lovaza 2. START Vascepa - take 1 capsule twice daily 3. TAKE Pravastatin at bedtime  LIMIT alcoholic beverages to 14 drinks per week.  If you need a refill on your cardiac medications before your next appointment, please call your pharmacy.   Lab work: Your physician recommends that you return for lab work in 3 months - FASTING.  If you have labs (blood work) drawn today and your tests are completely normal, you will receive your results only by: Marland Kitchen MyChart Message (if you have MyChart) OR . A paper copy in the mail If you have any lab test that is abnormal or we need to change your treatment, we will call you to review the results.  Follow-Up: At [facility], you and your health needs are our priority.  As part of our continuing mission to provide you with exceptional heart care, we have created designated Provider Care Teams.  These Care Teams include your primary Cardiologist (physician) and Advanced Practice Providers (APPs -  Physician Assistants and Nurse Practitioners) who all work together to provide you with the care you need, when you need it. You will need a follow up appointment in 12 months.  You may see Sanda Klein, MD or one of the following Advanced Practice Providers on your designated Care Team: [place], [place] . Fabian Sharp, PA-C . You will receive a reminder letter in the mail two months in advance. If you don't receive a letter, please call our office to schedule the follow-up appointment.

## 2018-02-18 NOTE — Progress Notes (Signed)
Cardiology Consultation Note:    Date:  02/18/2018   ID:  Stephen Davila, DOB 02/15/1961, MRN [id number]  PCP:  Dorothyann Peng, NP  Cardiologist:  Sanda Klein, MD  Electrophysiologist:  None   Referring MD: Dorothyann Peng, NP   Chief Complaint  Patient presents with  . Hyperlipidemia  . Coronary Artery Disease    Coronary calcification seen on CT, asymptomatic  Stephen Davila is a 57 y.o. male who is being seen today for the evaluation of dyslipidemia at the request of Dorothyann Peng, NP.   History of Present Illness:    Stephen Davila is a 57 y.o. male with a hx of coronary artery calcifications detected incidentally during chest CT in 2013, as part of a clinical trial.  He has not had symptoms of coronary insufficiency and has never undergone cardiac catheterization.  He has COPD but quit smoking in 2011 (2015 pulmonary function tests showed FEV1 of 63.6%, postbronchodilator 69.9%).  He has treated hypertension and has mixed hyperlipidemia.  The patient specifically denies any chest pain at rest exertion, dyspnea at rest or with exertion, orthopnea, paroxysmal nocturnal dyspnea, syncope, palpitations, focal neurological deficits, intermittent claudication, lower extremity edema, unexplained weight gain, cough, hemoptysis or wheezing.  Despite aggressive treatment of his hyperlipidemia he continues to have fairly severe hypertriglyceridemia.  While on treatment with pravastatin, Lovaza and fenofibrate his triglycerides well over 500.  He is only taking half of the prescribed dose of omega-3 supplement and he is taking his pravastatin in the morning.  He does not engage in regular physical activity, but he is lean.  He consumes about 24 beers a week, only at the end of each day.  He never drinks during the daytime but does not think he has alcohol dependency.  He does not have a family history of premature coronary artery disease.  Both parents are alive  in their 72s.  Past Medical History:  Diagnosis Date  . COPD (chronic obstructive pulmonary disease) ([place])   . Hyperlipidemia   . Hypertension     Past Surgical History:  Procedure Laterality Date  . CERVICAL SPINE SURGERY  10-01-12   5,6,7    Current Medications: Current Meds  Medication Sig  . fenofibrate micronized (LOFIBRA) 134 MG capsule Take 134 mg by mouth daily before breakfast.  . pravastatin (PRAVACHOL) 40 MG tablet Take 1 tablet (40 mg total) by mouth daily.  Marland Kitchen PROAIR HFA 108 (90 Base) MCG/ACT inhaler TAKE 2 PUFFS BY MOUTH EVERY 6 HOURS AS NEEDED FOR WHEEZE  . SYMBICORT 80-4.5 MCG/ACT inhaler INHALE 2 PUFFS INTO THE LUNGS TWICE DAILY  . telmisartan (MICARDIS) 40 MG tablet Take 1 tablet (40 mg total) by mouth daily.  . [DISCONTINUED] fenofibrate micronized (LOFIBRA) 134 MG capsule Take 1 capsule (134 mg total) by mouth daily before breakfast.  . [DISCONTINUED] omega-3 acid ethyl esters (LOVAZA) 1 g capsule Take 2 capsules (2 g total) by mouth 2 (two) times daily. (Patient taking differently: Take 1 g by mouth 2 (two) times daily. )     Allergies:   Patient has no active allergies.   Social History   Socioeconomic History  . Marital status: Married    Spouse name: Not on file  . Number of children: Not on file  . Years of education: Not on file  . Highest education level: Not on file  Occupational History  . Not on file  Social Needs  . Financial resource strain: Not on file  . Food insecurity:  Worry: Not on file    Inability: Not on file  . Transportation needs:    Medical: Not on file    Non-medical: Not on file  Tobacco Use  . Smoking status: Former Smoker    Packs/day: 2.00    Years: 35.00    Pack years: 70.00    Last attempt to quit: 06/15/2009    Years since quitting: 8.6  . Smokeless tobacco: Never Used  Substance and Sexual Activity  . Alcohol use: Yes    Alcohol/week: 42.0 standard drinks    Types: 42 Cans of beer per week  . Drug use: No    . Sexual activity: Yes  Lifestyle  . Physical activity:    Days per week: Not on file    Minutes per session: Not on file  . Stress: Not on file  Relationships  . Social connections:    Talks on phone: Not on file    Gets together: Not on file    Attends religious service: Not on file    Active member of club or organization: Not on file    Attends meetings of clubs or organizations: Not on file    Relationship status: Not on file  Other Topics Concern  . Not on file  Social History Narrative   [name] for 66 years    Married    No kids      Family History: The patient's family history includes Arthritis in his mother; Heart disease in his father; Melanoma in his sister; Prostate cancer in his father. There is no history of Diabetes, Stroke, Kidney disease, Hypertension, Hyperlipidemia, Colon cancer, Pancreatic cancer, or Stomach cancer.  ROS:   Please see the history of present illness.     All other systems reviewed and are negative.  EKGs/Labs/Other Studies Reviewed:    The following studies were reviewed today: Notes from [facility], labs.  EKG:  EKG is ordered today.  The ekg ordered today demonstrates normal sinus rhythm, nonspecific minor T wave changes, QTC 434 ms.  Recent Labs: 11/25/2017: ALT 19; BUN 22; Creatinine, Ser 1.02; Hemoglobin 15.2; Platelets 290.0; Potassium 4.9; Sodium 137; TSH 1.26  Recent Lipid Panel    Component Value Date/Time   CHOL 227 (H) 11/25/2017 0954   TRIG (H) 11/25/2017 0954    575.0 Triglyceride is over 400; calculations on Lipids are invalid.   HDL 46.60 11/25/2017 0954   CHOLHDL 5 11/25/2017 0954   VLDL 39.0 11/24/2016 1119   LDLCALC 107 (H) 11/24/2016 1119   LDLDIRECT 87.0 11/25/2017 0954    Physical Exam:    VS:  BP 132/66 (BP Location: Left Arm, Patient Position: Sitting, Cuff Size: Normal)   Pulse 83   Ht 5\' 11"  (1.803 m)   Wt 170 lb (77.1 kg)   BMI 23.71 kg/m     Wt Readings from Last 3 Encounters:  02/18/18  170 lb (77.1 kg)  11/25/17 163 lb (73.9 kg)  11/24/16 160 lb (72.6 kg)     GEN: Overall lean, but some prominent ventral adiposity, well nourished, well developed in no acute distress HEENT: Normal NECK: No JVD; No carotid bruits LYMPHATICS: No lymphadenopathy CARDIAC: RRR, no murmurs, rubs, gallops RESPIRATORY:  Clear to auscultation without rales, wheezing or rhonchi  ABDOMEN: Soft, non-tender, non-distended MUSCULOSKELETAL:  No edema; No deformity  SKIN: Warm and dry.  No exanthem is seen NEUROLOGIC:  Alert and oriented x 3 PSYCHIATRIC:  Normal affect   ASSESSMENT:    1. Hyperlipidemia with target LDL  less than 100   2. Atherosclerosis of native coronary artery of native heart without angina pectoris   3. Hypertriglyceridemia   4. Alcohol use   5. Medication management    PLAN:    In order of problems listed above:  1. CAD: Completely asymptomatic, normal ECG.  At some point is reasonable for him to have a plain treadmill stress test, but it is been 6 years since his coronary calcification was detected and he has not had any events or symptoms.  The focus is on risk factor modification.  He has stopped smoking, has lean body habitus and we are working on his hyperlipidemia.  It would be highly beneficial for him to engage in regular physical exercise. 2. HLP: He has mixed hyperlipidemia with persistent severe elevation in triglyceride levels despite statin, fibrate and omega-3 fatty acid supplements.  Plan to continue to fibrillate.  He should take the pravastatin with his evening meal or at bedtime for better efficacy.  Switch to Vascepa.  Recheck lipid profile in 3 months. 3. HTN: Well-controlled. 4. Alcohol use: Discussed the fact that adverse outcomes clearly increase when exceeding roughly 14 drinks a week.  He drinks about twice that.  Asked him to try to cut back to no more than 2 beers each evening.  The excessive alcohol intake is likely contributing to the  hypertriglyceridemia.   Medication Adjustments/Labs and Tests Ordered: Current medicines are reviewed at length with the patient today.  Concerns regarding medicines are outlined above.  Orders Placed This Encounter  Procedures  . Lipid panel  . Comprehensive metabolic panel  . EKG 12-Lead   Meds ordered this encounter  Medications  . Icosapent Ethyl (VASCEPA) 1 g CAPS    Sig: Take 2 capsules (2 g total) by mouth 2 (two) times daily.    Dispense:  120 capsule    Refill:  5    Patient Instructions  Medication Instructions:  Dr Sallyanne Kuster has recommended making the following medication changes: 1. STOP Lovaza 2. START Vascepa - take 1 capsule twice daily 3. TAKE Pravastatin at bedtime  LIMIT alcoholic beverages to 14 drinks per week.  If you need a refill on your cardiac medications before your next appointment, please call your pharmacy.   Lab work: Your physician recommends that you return for lab work in 3 months - FASTING.  If you have labs (blood work) drawn today and your tests are completely normal, you will receive your results only by: Marland Kitchen MyChart Message (if you have MyChart) OR . A paper copy in the mail If you have any lab test that is abnormal or we need to change your treatment, we will call you to review the results.  Follow-Up: At [facility], you and your health needs are our priority.  As part of our continuing mission to provide you with exceptional heart care, we have created designated Provider Care Teams.  These Care Teams include your primary Cardiologist (physician) and Advanced Practice Providers (APPs -  Physician Assistants and Nurse Practitioners) who all work together to provide you with the care you need, when you need it. You will need a follow up appointment in 12 months.  You may see Sanda Klein, MD or one of the following Advanced Practice Providers on your designated Care Team: [place], [place] . Fabian Sharp, PA-C . You will receive a  reminder letter in the mail two months in advance. If you don't receive a letter, please call our office to schedule the  follow-up appointment.    Signed, Sanda Klein, MD  02/18/2018 6:41 PM    [place]

## 2018-03-04 ENCOUNTER — Encounter: Payer: Self-pay | Admitting: Adult Health

## 2018-05-10 ENCOUNTER — Ambulatory Visit (INDEPENDENT_AMBULATORY_CARE_PROVIDER_SITE_OTHER): Payer: BLUE CROSS/BLUE SHIELD

## 2018-05-10 ENCOUNTER — Encounter (INDEPENDENT_AMBULATORY_CARE_PROVIDER_SITE_OTHER): Payer: Self-pay | Admitting: Orthopaedic Surgery

## 2018-05-10 ENCOUNTER — Ambulatory Visit (INDEPENDENT_AMBULATORY_CARE_PROVIDER_SITE_OTHER): Payer: BLUE CROSS/BLUE SHIELD | Admitting: Orthopaedic Surgery

## 2018-05-10 DIAGNOSIS — M7541 Impingement syndrome of right shoulder: Secondary | ICD-10-CM

## 2018-05-10 DIAGNOSIS — M25511 Pain in right shoulder: Secondary | ICD-10-CM

## 2018-05-10 MED ORDER — LIDOCAINE HCL 1 % IJ SOLN
3.0000 mL | INTRAMUSCULAR | Status: AC | PRN
  Administered 2018-05-10: 3 mL

## 2018-05-10 MED ORDER — METHYLPREDNISOLONE ACETATE 40 MG/ML IJ SUSP
40.0000 mg | INTRAMUSCULAR | Status: AC | PRN
  Administered 2018-05-10: 40 mg via INTRA_ARTICULAR

## 2018-05-10 NOTE — Progress Notes (Signed)
Office Visit Note   Patient: Stephen Davila           Date of Birth: 1960-03-28           MRN: [id number] Visit Date: 05/10/2018              Requested by: Dorothyann Peng, NP [address], [place] [address] PCP: Dorothyann Peng, NP   Assessment & Plan: Visit Diagnoses:  1. Right shoulder pain, unspecified chronicity   2. Impingement syndrome of right shoulder     Plan: Given the fact that he is done so well with a steroid shot in his left shoulder he would like to have this on his right shoulder and I agree with this as well.  He understands fully the risk and benefits of injections and tolerated this well.  All questions and concerns were answered and addressed.  He will follow-up as needed.  Follow-Up Instructions: Return if symptoms worsen or fail to improve.   Orders:  Orders Placed This Encounter  Procedures  . Large Joint Inj  . XR Shoulder Right   No orders of the defined types were placed in this encounter.     Procedures: Large Joint Inj: R subacromial bursa on 05/10/2018 4:27 PM Indications: pain and diagnostic evaluation Details: 22 G 1.5 in needle  Arthrogram: No  Medications: 3 mL lidocaine 1 %; 40 mg methylPREDNISolone acetate 40 MG/ML Outcome: tolerated well, no immediate complications Procedure, treatment alternatives, risks and benefits explained, specific risks discussed. Consent was given by the patient. Immediately prior to procedure a time out was called to verify the correct patient, procedure, equipment, support staff and site/side marked as required. Patient was prepped and draped in the usual sterile fashion.       Clinical Data: No additional findings.   Subjective: Chief Complaint  Patient presents with  . Right Shoulder - Pain  The patient comes in today for evaluation treatment of 1 month history of right shoulder pain.  He is left-hand dominant.  I actually injected his left shoulder back in November 2017 for  similar issues but he had waited a while before he came in for that injection.  He said the injection helped greatly.  He hopes that helps resolve the shoulder.  He denies any injury.  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  He does not do a lot of overhead activities.  He does not remember any type of injury but he said it slowly come on with time with reaching overhead and behind him with his right shoulder it does occasionally wake him up at night.  He denies any neck pain he denies any numbness and tingling in his right upper extremity.  He has had previous cervical spine surgery.  HPI  Review of Systems He currently denies any headache, chest pain, shortness of breath, fever, chills, nausea, vomiting  Objective: Vital Signs: There were no vitals taken for this visit.  Physical Exam He is alert and orient x3 and in no acute distress Ortho Exam Examination of his right shoulder shows that he moves fluidly.  He has good strength in rotator cuff except with external rotation or slight weakness.  He does have positive Neer and Hawkins signs. Specialty Comments:  No specialty comments available.  Imaging: Xr Shoulder Right  Result Date: 05/10/2018 3 views of the right shoulder show well located shoulder with no acute findings.  There is moderate AC joint arthritic changes and decrease  the subacromial outlet    PMFS History: Patient Active Problem List   Diagnosis Date Noted  . Essential hypertension 07/02/2015  . Hypertriglyceridemia 01/19/2014  . Discoid eczema 03/30/2013  . Coronary atherosclerosis of native coronary artery 11/03/2012  . Hyperlipidemia with target LDL less than 100 11/03/2012  . Routine general medical examination at a health care facility 10/29/2011  . COPD GOLD I 06/20/2009   Past Medical History:  Diagnosis Date  . COPD (chronic obstructive pulmonary disease) ([place])   . Hyperlipidemia   . Hypertension     Family History  Problem Relation Age of  Onset  . Prostate cancer Father   . Heart disease Father   . Melanoma Sister   . Arthritis Mother   . Diabetes Neg Hx   . Stroke Neg Hx   . Kidney disease Neg Hx   . Hypertension Neg Hx   . Hyperlipidemia Neg Hx   . Colon cancer Neg Hx   . Pancreatic cancer Neg Hx   . Stomach cancer Neg Hx     Past Surgical History:  Procedure Laterality Date  . CERVICAL SPINE SURGERY  10-01-12   5,6,7   Social History   Occupational History  . Not on file  Tobacco Use  . Smoking status: Former Smoker    Packs/day: 2.00    Years: 35.00    Pack years: 70.00    Last attempt to quit: 06/15/2009    Years since quitting: 8.9  . Smokeless tobacco: Never Used  Substance and Sexual Activity  . Alcohol use: Yes    Alcohol/week: 42.0 standard drinks    Types: 42 Cans of beer per week  . Drug use: No  . Sexual activity: Yes

## 2018-05-12 ENCOUNTER — Other Ambulatory Visit: Payer: Self-pay | Admitting: Internal Medicine

## 2018-05-12 DIAGNOSIS — J449 Chronic obstructive pulmonary disease, unspecified: Secondary | ICD-10-CM

## 2018-05-31 ENCOUNTER — Encounter (INDEPENDENT_AMBULATORY_CARE_PROVIDER_SITE_OTHER): Payer: Self-pay | Admitting: Orthopaedic Surgery

## 2018-06-04 ENCOUNTER — Telehealth (INDEPENDENT_AMBULATORY_CARE_PROVIDER_SITE_OTHER): Payer: Self-pay

## 2018-06-04 NOTE — Telephone Encounter (Signed)
Called pt and he answered NO to all COVID-19 questions. Pt has an appt with CB on Monday.

## 2018-06-07 ENCOUNTER — Encounter (INDEPENDENT_AMBULATORY_CARE_PROVIDER_SITE_OTHER): Payer: Self-pay | Admitting: Orthopaedic Surgery

## 2018-06-07 ENCOUNTER — Ambulatory Visit (INDEPENDENT_AMBULATORY_CARE_PROVIDER_SITE_OTHER): Payer: BLUE CROSS/BLUE SHIELD | Admitting: Orthopaedic Surgery

## 2018-06-07 ENCOUNTER — Other Ambulatory Visit: Payer: Self-pay

## 2018-06-07 DIAGNOSIS — M7541 Impingement syndrome of right shoulder: Secondary | ICD-10-CM | POA: Diagnosis not present

## 2018-06-07 MED ORDER — METHYLPREDNISOLONE 4 MG PO TABS: ORAL_TABLET | ORAL | 0 refills | Status: DC

## 2018-06-07 NOTE — Progress Notes (Signed)
The patient is return in 1 month after a subacromial steroid injection in his right nondominant shoulder.  He said that shoulder injection did great for about a week but now things are flaring up again and he wants to have another injection today.  He understands that although he is not a diabetic it is still not warranted to have a steroid injection again in the same spot in such a short amount of time.  On exam his range of motion is entirely full.  There is some slight weakness with external rotation but he still shows signs of impingement syndrome but no deficits of the cuff itself that I can tell.  I will send in a 6-day steroid taper and we can always see him back in 4 weeks for repeat injection if needed.  Another option would be obtaining an MRI at some point.  This would only be if he is continuing to have issues.

## 2018-09-08 ENCOUNTER — Ambulatory Visit (INDEPENDENT_AMBULATORY_CARE_PROVIDER_SITE_OTHER): Payer: BC Managed Care – PPO | Admitting: Orthopaedic Surgery

## 2018-09-08 ENCOUNTER — Other Ambulatory Visit: Payer: Self-pay

## 2018-09-08 DIAGNOSIS — M7541 Impingement syndrome of right shoulder: Secondary | ICD-10-CM | POA: Diagnosis not present

## 2018-09-08 MED ORDER — LIDOCAINE HCL 1 % IJ SOLN
3.0000 mL | INTRAMUSCULAR | Status: AC | PRN
  Administered 2018-09-08: 3 mL

## 2018-09-08 MED ORDER — METHYLPREDNISOLONE ACETATE 40 MG/ML IJ SUSP
40.0000 mg | INTRAMUSCULAR | Status: AC | PRN
  Administered 2018-09-08: 40 mg via INTRA_ARTICULAR

## 2018-09-08 NOTE — Progress Notes (Signed)
Office Visit Note   Patient: Stephen Davila           Date of Birth: [birth year]           MRN: [id number] Visit Date: 09/08/2018              Requested by: Dorothyann Peng, NP [address],  [place] [address] PCP: Dorothyann Peng, NP   Assessment & Plan: Visit Diagnoses:  1. Impingement syndrome of right shoulder     Plan: I do feel that is appropriate to try a steroid injection in the subacromial space again.  It is been 4 months since his last steroid injection steroids reasonable to try this.  All question concerns were answered and addressed.  He understands the risk of injections as well.  I also recommended Voltaren gel.  Follow-up as otherwise as needed.  Follow-Up Instructions: Return if symptoms worsen or fail to improve.   Orders:  Orders Placed This Encounter  Procedures  . Large Joint Inj   No orders of the defined types were placed in this encounter.     Procedures: Large Joint Inj: R subacromial bursa on 09/08/2018 4:01 PM Indications: pain and diagnostic evaluation Details: 22 G 1.5 in needle  Arthrogram: No  Medications: 3 mL lidocaine 1 %; 40 mg methylPREDNISolone acetate 40 MG/ML Outcome: tolerated well, no immediate complications Procedure, treatment alternatives, risks and benefits explained, specific risks discussed. Consent was given by the patient. Immediately prior to procedure a time out was called to verify the correct patient, procedure, equipment, support staff and site/side marked as required. Patient was prepped and draped in the usual sterile fashion.       Clinical Data: No additional findings.   Subjective: Chief Complaint  Patient presents with  . Right Shoulder - Pain  The patient is well-known to me.  He comes in for evaluation treatment of right shoulder pain.  We actually saw him for this before in early March.  We felt that it was impingement syndrome and place a steroid injection in the subacromial  space.  He said the injection actually did well but he got right back some heavy lifting and activities and is been aching enough that he would like to consider repeat injection.  He denies any weakness in the shoulder.  He did have a left shoulder injection about 2 years ago and it never hurt after that.  This is the right shoulder still continues to bother him.  He said his only deficit is reaching behind him which is limited.  Overhead activities and reaching across the front also cause pain.  He denies any neck pain he denies any numbness and tingling in his hand or weakness.  HPI  Review of Systems He currently denies any headache, chest pain, shortness of breath, fever, chills, nausea, vomiting  Objective: Vital Signs: There were no vitals taken for this visit.  Physical Exam He is alert and orient x3 and in no acute distress Ortho Exam Examination of his right shoulder shows his range of motion is almost full.  He has certainly little stiff with internal rotation combined with adduction but overall has a negative liftoff and strength is good in both his left and right shoulders and all muscle groups on the right shoulder. Specialty Comments:  No specialty comments available.  Imaging: No results found.   PMFS History: Patient Active Problem List   Diagnosis Date Noted  . Essential hypertension 07/02/2015  . Hypertriglyceridemia 01/19/2014  .  Discoid eczema 03/30/2013  . Coronary atherosclerosis of native coronary artery 11/03/2012  . Hyperlipidemia with target LDL less than 100 11/03/2012  . Routine general medical examination at a health care facility 10/29/2011  . COPD GOLD I 06/20/2009   Past Medical History:  Diagnosis Date  . COPD (chronic obstructive pulmonary disease) ([place])   . Hyperlipidemia   . Hypertension     Family History  Problem Relation Age of Onset  . Prostate cancer Father   . Heart disease Father   . Melanoma Sister   . Arthritis Mother   .  Diabetes Neg Hx   . Stroke Neg Hx   . Kidney disease Neg Hx   . Hypertension Neg Hx   . Hyperlipidemia Neg Hx   . Colon cancer Neg Hx   . Pancreatic cancer Neg Hx   . Stomach cancer Neg Hx     Past Surgical History:  Procedure Laterality Date  . CERVICAL SPINE SURGERY  10-01-12   5,6,7   Social History   Occupational History  . Not on file  Tobacco Use  . Smoking status: Former Smoker    Packs/day: 2.00    Years: 35.00    Pack years: 70.00    Quit date: 06/15/2009    Years since quitting: 9.2  . Smokeless tobacco: Never Used  Substance and Sexual Activity  . Alcohol use: Yes    Alcohol/week: 42.0 standard drinks    Types: 42 Cans of beer per week  . Drug use: No  . Sexual activity: Yes

## 2018-10-15 ENCOUNTER — Other Ambulatory Visit: Payer: Self-pay | Admitting: Cardiovascular Disease

## 2018-12-30 ENCOUNTER — Other Ambulatory Visit: Payer: Self-pay | Admitting: Adult Health

## 2018-12-30 NOTE — Telephone Encounter (Signed)
Spoke to the pt.  He will call back to reschedule.

## 2019-02-11 ENCOUNTER — Emergency Department (HOSPITAL_COMMUNITY)
Admission: EM | Admit: 2019-02-11 | Discharge: 2019-02-11 | Disposition: A | Discharge status: Home / Self Care | Payer: BC Managed Care – PPO | Attending: Emergency Medicine | Admitting: Emergency Medicine

## 2019-02-11 ENCOUNTER — Emergency Department (HOSPITAL_COMMUNITY): Payer: BC Managed Care – PPO

## 2019-02-11 ENCOUNTER — Telehealth: Payer: Self-pay | Admitting: Adult Health

## 2019-02-11 ENCOUNTER — Encounter (HOSPITAL_COMMUNITY): Payer: Self-pay

## 2019-02-11 ENCOUNTER — Other Ambulatory Visit: Payer: Self-pay

## 2019-02-11 DIAGNOSIS — Z87891 Personal history of nicotine dependence: Secondary | ICD-10-CM | POA: Diagnosis not present

## 2019-02-11 DIAGNOSIS — J449 Chronic obstructive pulmonary disease, unspecified: Secondary | ICD-10-CM | POA: Diagnosis not present

## 2019-02-11 DIAGNOSIS — U071 COVID-19: Secondary | ICD-10-CM | POA: Diagnosis not present

## 2019-02-11 DIAGNOSIS — J1289 Other viral pneumonia: Secondary | ICD-10-CM | POA: Diagnosis not present

## 2019-02-11 DIAGNOSIS — Z79899 Other long term (current) drug therapy: Secondary | ICD-10-CM | POA: Diagnosis not present

## 2019-02-11 DIAGNOSIS — R0602 Shortness of breath: Secondary | ICD-10-CM | POA: Diagnosis present

## 2019-02-11 DIAGNOSIS — I10 Essential (primary) hypertension: Secondary | ICD-10-CM | POA: Insufficient documentation

## 2019-02-11 DIAGNOSIS — J1282 Pneumonia due to coronavirus disease 2019: Secondary | ICD-10-CM

## 2019-02-11 LAB — BASIC METABOLIC PANEL
Anion gap: 13 (ref 5–15)
BUN: 14 mg/dL (ref 6–20)
CO2: 22 mmol/L (ref 22–32)
Calcium: 8.3 mg/dL — ABNORMAL LOW (ref 8.9–10.3)
Chloride: 100 mmol/L (ref 98–111)
Creatinine, Ser: 0.97 mg/dL (ref 0.61–1.24)
GFR calc Af Amer: >60 mL/min (ref >60)
GFR calc non Af Amer: >60 mL/min (ref >60)
Glucose, Bld: 100 mg/dL — ABNORMAL HIGH (ref 70–99)
Potassium: 4 mmol/L (ref 3.5–5.1)
Sodium: 135 mmol/L (ref 135–145)

## 2019-02-11 LAB — CBC WITH DIFFERENTIAL/PLATELET
Abs Immature Granulocytes: 0.04 10*3/uL (ref 0.00–0.07)
Basophils Absolute: 0 10*3/uL (ref 0.0–0.1)
Basophils Relative: 1 %
Eosinophils Absolute: 0 10*3/uL (ref 0.0–0.5)
Eosinophils Relative: 0 %
HCT: 47.5 % (ref 39.0–52.0)
Hemoglobin: 16.1 g/dL (ref 13.0–17.0)
Immature Granulocytes: 1 %
Lymphocytes Relative: 24 %
Lymphs Abs: 1.2 10*3/uL (ref 0.7–4.0)
MCH: 33.3 pg (ref 26.0–34.0)
MCHC: 33.9 g/dL (ref 30.0–36.0)
MCV: 98.3 fL (ref 80.0–100.0)
Monocytes Absolute: 0.4 10*3/uL (ref 0.1–1.0)
Monocytes Relative: 8 %
Neutro Abs: 3.3 10*3/uL (ref 1.7–7.7)
Neutrophils Relative %: 66 %
Platelets: 205 10*3/uL (ref 150–400)
RBC: 4.83 MIL/uL (ref 4.22–5.81)
RDW: 11.6 % (ref 11.5–15.5)
WBC: 5 10*3/uL (ref 4.0–10.5)
nRBC: 0 % (ref 0.0–0.2)

## 2019-02-11 LAB — BRAIN NATRIURETIC PEPTIDE: B Natriuretic Peptide: 38.6 pg/mL (ref 0.0–100.0)

## 2019-02-11 MED ORDER — DOXYCYCLINE HYCLATE 100 MG PO TABS
100.0000 mg | ORAL_TABLET | Freq: Once | ORAL | Status: AC
  Administered 2019-02-11: 100 mg via ORAL
  Filled 2019-02-11: qty 1

## 2019-02-11 MED ORDER — ACETAMINOPHEN 500 MG PO TABS
1000.0000 mg | ORAL_TABLET | Freq: Once | ORAL | Status: AC
  Administered 2019-02-11: 1000 mg via ORAL
  Filled 2019-02-11: qty 2

## 2019-02-11 MED ORDER — DOXYCYCLINE HYCLATE 100 MG PO CAPS: 100.0000 mg | ORAL_CAPSULE | Freq: Two times a day (BID) | ORAL | 0 refills | Status: DC

## 2019-02-11 NOTE — Telephone Encounter (Signed)
Copied from [place] [phone] [id number]. Topic: General - Other >> Feb 11, 2019 11:29 AM Keene Breath wrote: Reason for CRM: Patient has tested positive for COVID and is not getting better.  Stated that he does not want to go to the hospital.  Would like to know what the nurse or doctor could recommend.  Wife's # [phone]

## 2019-02-11 NOTE — ED Provider Notes (Signed)
[facility] DEPT Provider Note   CSN: [id number] Arrival date & time: 02/11/19  1232     History   Chief Complaint Chief Complaint  Patient presents with  . Shortness of Breath    HPI Stephen Davila is a 58 y.o. male.     HPI Patient presents concern of weakness, dyspnea, cough, myalgia. Patient developed illness about 1 week ago.  He was diagnosed positive for coronavirus slightly thereafter. He notes that his recovery has been waxing, waning, but was generally benign until yesterday. , Now since yesterday he has had worsening general weakness, soreness, increased work of breathing. No new fever, no new pain anywhere, no confusion, disorientation, vomiting, diarrhea. He has been taking his typical medication as directed, including meds for COPD.  Today after speaking with his primary care physician, relating that he had worsening overall condition, he was sent here for evaluation. Past Medical History:  Diagnosis Date  . COPD (chronic obstructive pulmonary disease) ([place])   . Hyperlipidemia   . Hypertension     Patient Active Problem List   Diagnosis Date Noted  . Essential hypertension 07/02/2015  . Hypertriglyceridemia 01/19/2014  . Discoid eczema 03/30/2013  . Coronary atherosclerosis of native coronary artery 11/03/2012  . Hyperlipidemia with target LDL less than 100 11/03/2012  . Routine general medical examination at a health care facility 10/29/2011  . COPD GOLD I 06/20/2009    Past Surgical History:  Procedure Laterality Date  . CERVICAL SPINE SURGERY  10-01-12   5,6,7        Home Medications    Prior to Admission medications   Medication Sig Start Date End Date Taking? Authorizing Provider  fenofibrate micronized (LOFIBRA) 134 MG capsule TAKE 1 CAPSULE (134 MG TOTAL) BY MOUTH DAILY BEFORE BREAKFAST. 12/30/18   Nafziger, Tommi Rumps, NP  methylPREDNISolone (MEDROL) 4 MG tablet Medrol dose pack. Take as instructed  06/07/18   Mcarthur Rossetti, MD  pravastatin (PRAVACHOL) 40 MG tablet Take 1 tablet (40 mg total) by mouth daily. 11/25/17   Dorothyann Peng, NP  PROAIR HFA 108 [phone] Base) MCG/ACT inhaler TAKE 2 PUFFS BY MOUTH EVERY 6 HOURS AS NEEDED FOR WHEEZE 02/01/18   Janith Lima, MD  SYMBICORT 80-4.5 MCG/ACT inhaler INHALE 2 PUFFS BY MOUTH TWICE A DAY 05/12/18   Janith Lima, MD  telmisartan (MICARDIS) 40 MG tablet Take 1 tablet (40 mg total) by mouth daily. 11/25/17   Nafziger, Tommi Rumps, NP  VASCEPA 1 g CAPS TAKE 2 CAPSULES TWICE A DAY 10/15/18   Croitoru, Mihai, MD    Family History Family History  Problem Relation Age of Onset  . Prostate cancer Father   . Heart disease Father   . Melanoma Sister   . Arthritis Mother   . Diabetes Neg Hx   . Stroke Neg Hx   . Kidney disease Neg Hx   . Hypertension Neg Hx   . Hyperlipidemia Neg Hx   . Colon cancer Neg Hx   . Pancreatic cancer Neg Hx   . Stomach cancer Neg Hx     Social History Social History   Tobacco Use  . Smoking status: Former Smoker    Packs/day: 2.00    Years: 35.00    Pack years: 70.00    Quit date: 06/15/2009    Years since quitting: 9.6  . Smokeless tobacco: Never Used  Substance Use Topics  . Alcohol use: Yes    Alcohol/week: 42.0 standard drinks    Types: 42 Cans  of beer per week    Comment: socially   . Drug use: No     Allergies   Patient has no known allergies.   Review of Systems Review of Systems  Constitutional:       Per HPI, otherwise negative  HENT:       Per HPI, otherwise negative  Respiratory:       Per HPI, otherwise negative  Cardiovascular:       Per HPI, otherwise negative  Gastrointestinal: Negative for vomiting.  Endocrine:       Negative aside from HPI  Genitourinary:       Neg aside from HPI   Musculoskeletal:       Per HPI, otherwise negative  Skin: Negative.   Neurological: Positive for weakness. Negative for syncope.     Physical Exam Updated Vital Signs BP 120/73 (BP  Location: Right Arm)   Pulse 91   Temp (!) 100.7 F (38.2 C) (Oral)   Resp (!) 25   Ht 5\' 11"  (1.803 m)   Wt 72.6 kg   SpO2 96%   BMI 22.32 kg/m   Physical Exam Vitals signs and nursing note reviewed.  Constitutional:      General: He is not in acute distress.    Appearance: He is well-developed.  HENT:     Head: Normocephalic and atraumatic.  Eyes:     Conjunctiva/sclera: Conjunctivae normal.  Cardiovascular:     Rate and Rhythm: Normal rate and regular rhythm.     Pulses: Normal pulses.  Pulmonary:     Effort: Pulmonary effort is normal. No respiratory distress.     Breath sounds: No stridor.  Abdominal:     General: There is no distension.  Musculoskeletal:     Right lower leg: No edema.     Left lower leg: No edema.  Skin:    General: Skin is warm and dry.  Neurological:     Mental Status: He is alert and oriented to person, place, and time.      ED Treatments / Results  Labs (all labs ordered are listed, but only abnormal results are displayed) Labs Reviewed  BASIC METABOLIC PANEL - Abnormal; Notable for the following components:      Result Value   Glucose, Bld 100 (*)    Calcium 8.3 (*)    All other components within normal limits  BRAIN NATRIURETIC PEPTIDE  CBC WITH DIFFERENTIAL/PLATELET    EKG EKG Interpretation  Date/Time:  Friday February 11 2019 13:03:49 EST Ventricular Rate:  85 PR Interval:    QRS Duration: 91 QT Interval:  358 QTC Calculation: 426 R Axis:   50 Text Interpretation: Sinus rhythm T wave abnormality Abnormal ECG Confirmed by Carmin Muskrat [phone]) on 02/11/2019 1:56:55 PM   Radiology Dg Chest Port 1 View  Result Date: 02/11/2019 CLINICAL DATA:  Shortness of breath, recent positive COVID test EXAM: PORTABLE CHEST 1 VIEW COMPARISON:  2014 FINDINGS: Mild interstitial prominence. Patchy density at the lung bases. No pleural effusion or pneumothorax. Normal heart size. Cervicothoracic fusion hardware is noted. IMPRESSION: Mild  interstitial prominence and patchy bibasilar density. This may reflect changes of COVID-19. Electronically Signed   By: Macy Mis M.D.   On: 02/11/2019 14:52    Procedures Procedures (including critical care time)  Medications Ordered in ED Medications  doxycycline (VIBRA-TABS) tablet 100 mg (has no administration in time range)  acetaminophen (TYLENOL) tablet 1,000 mg (1,000 mg Oral Given 02/11/19 1529)     Initial Impression /  Assessment and Plan / ED Course  I have reviewed the triage vital signs and the nursing notes.  Pertinent labs & imaging results that were available during my care of the patient were reviewed by me and considered in my medical decision making (see chart for details).        4:13 PM Patient in no distress, awake, alert, speaking clearly, no hypoxia, hypotension. Findings reviewed, generally reassuring.  And there is evidence for pneumonia, likely consistent with Covid, though given the patient's generalized improvement, followed by recent decline over the past 2 days, there is consideration of concurrent bacterial infection as well.  Patient amenable to course of doxycycline, will continue other adjuvant therapy.  With generally reassuring findings he is appropriate for discharge with outpatient follow-up.  Final Clinical Impressions(s) / ED Diagnoses   Final diagnoses:  Pneumonia due to COVID-19 virus    ED Discharge Orders         Ordered    doxycycline (VIBRAMYCIN) 100 MG capsule  2 times daily     02/11/19 1616           Carmin Muskrat, MD 02/11/19 1616

## 2019-02-11 NOTE — Telephone Encounter (Signed)
Left a message for the pt to call and schedule a virtual visit with [facility].

## 2019-02-11 NOTE — Telephone Encounter (Signed)
See note

## 2019-02-11 NOTE — Discharge Instructions (Signed)
As discussed, your evaluation today has been largely reassuring.  But, it is important that you monitor your condition carefully, and do not hesitate to return to the ED if you develop new, or concerning changes in your condition. ? ?Otherwise, please follow-up with your physician for appropriate ongoing care. ? ?

## 2019-02-11 NOTE — ED Triage Notes (Signed)
Pt presents with c/o shortness of breath. Pt tested positive the day after Thanksgiving. Pt reports he wants further evaluation because he is short of breath. Pt wants further evaluation.

## 2019-02-15 NOTE — Telephone Encounter (Signed)
Pt seen in ED on 02/11/2019 and treated for PNA.  Nothing further needed.

## 2019-02-22 ENCOUNTER — Other Ambulatory Visit: Payer: Self-pay | Admitting: Adult Health

## 2019-02-22 DIAGNOSIS — I2510 Atherosclerotic heart disease of native coronary artery without angina pectoris: Secondary | ICD-10-CM

## 2019-02-22 DIAGNOSIS — E785 Hyperlipidemia, unspecified: Secondary | ICD-10-CM

## 2019-02-23 NOTE — Telephone Encounter (Signed)
SENT TO THE PHARMACY FOR 30 DAYS. PT IS PAST DUE FOR CPX.

## 2019-03-07 ENCOUNTER — Other Ambulatory Visit: Payer: Self-pay | Admitting: Adult Health

## 2019-03-07 ENCOUNTER — Other Ambulatory Visit: Payer: Self-pay | Admitting: Internal Medicine

## 2019-03-07 DIAGNOSIS — I10 Essential (primary) hypertension: Secondary | ICD-10-CM

## 2019-03-07 DIAGNOSIS — I2510 Atherosclerotic heart disease of native coronary artery without angina pectoris: Secondary | ICD-10-CM

## 2019-03-07 DIAGNOSIS — J449 Chronic obstructive pulmonary disease, unspecified: Secondary | ICD-10-CM

## 2019-03-08 NOTE — Telephone Encounter (Signed)
30 DAY SUPPLY SENT TO THE PHARMACY.  PT IS DUE FOR CPX. 

## 2019-03-11 ENCOUNTER — Other Ambulatory Visit: Payer: Self-pay

## 2019-03-11 ENCOUNTER — Encounter (HOSPITAL_COMMUNITY): Payer: Self-pay

## 2019-03-11 ENCOUNTER — Emergency Department (HOSPITAL_COMMUNITY)
Admission: EM | Admit: 2019-03-11 | Discharge: 2019-03-11 | Disposition: A | Discharge status: Home / Self Care | Payer: BC Managed Care – PPO | Attending: Emergency Medicine | Admitting: Emergency Medicine

## 2019-03-11 ENCOUNTER — Emergency Department (HOSPITAL_COMMUNITY): Payer: BC Managed Care – PPO

## 2019-03-11 DIAGNOSIS — N201 Calculus of ureter: Secondary | ICD-10-CM | POA: Diagnosis not present

## 2019-03-11 DIAGNOSIS — J449 Chronic obstructive pulmonary disease, unspecified: Secondary | ICD-10-CM | POA: Diagnosis not present

## 2019-03-11 DIAGNOSIS — R112 Nausea with vomiting, unspecified: Secondary | ICD-10-CM

## 2019-03-11 DIAGNOSIS — R109 Unspecified abdominal pain: Secondary | ICD-10-CM | POA: Diagnosis present

## 2019-03-11 DIAGNOSIS — Z87891 Personal history of nicotine dependence: Secondary | ICD-10-CM | POA: Insufficient documentation

## 2019-03-11 DIAGNOSIS — R319 Hematuria, unspecified: Secondary | ICD-10-CM

## 2019-03-11 DIAGNOSIS — I10 Essential (primary) hypertension: Secondary | ICD-10-CM | POA: Insufficient documentation

## 2019-03-11 DIAGNOSIS — Z79899 Other long term (current) drug therapy: Secondary | ICD-10-CM | POA: Insufficient documentation

## 2019-03-11 DIAGNOSIS — I2510 Atherosclerotic heart disease of native coronary artery without angina pectoris: Secondary | ICD-10-CM | POA: Diagnosis not present

## 2019-03-11 LAB — BASIC METABOLIC PANEL
Anion gap: 8 (ref 5–15)
BUN: 16 mg/dL (ref 6–20)
CO2: 25 mmol/L (ref 22–32)
Calcium: 8.8 mg/dL — ABNORMAL LOW (ref 8.9–10.3)
Chloride: 112 mmol/L — ABNORMAL HIGH (ref 98–111)
Creatinine, Ser: 0.96 mg/dL (ref 0.61–1.24)
GFR calc Af Amer: >60 mL/min (ref >60)
GFR calc non Af Amer: >60 mL/min (ref >60)
Glucose, Bld: 104 mg/dL — ABNORMAL HIGH (ref 70–99)
Potassium: 4.5 mmol/L (ref 3.5–5.1)
Sodium: 145 mmol/L (ref 135–145)

## 2019-03-11 LAB — URINALYSIS, ROUTINE W REFLEX MICROSCOPIC
Bilirubin Urine: NEGATIVE
Glucose, UA: NEGATIVE mg/dL
Ketones, ur: NEGATIVE mg/dL
Leukocytes,Ua: NEGATIVE
Nitrite: NEGATIVE
Protein, ur: NEGATIVE mg/dL
Specific Gravity, Urine: 1.008 (ref 1.005–1.030)
pH: 5 (ref 5.0–8.0)

## 2019-03-11 LAB — CBC WITH DIFFERENTIAL/PLATELET
Abs Immature Granulocytes: 0.07 10*3/uL (ref 0.00–0.07)
Basophils Absolute: 0.1 10*3/uL (ref 0.0–0.1)
Basophils Relative: 1 %
Eosinophils Absolute: 0.4 10*3/uL (ref 0.0–0.5)
Eosinophils Relative: 5 %
HCT: 41.6 % (ref 39.0–52.0)
Hemoglobin: 13.7 g/dL (ref 13.0–17.0)
Immature Granulocytes: 1 %
Lymphocytes Relative: 51 %
Lymphs Abs: 3.8 10*3/uL (ref 0.7–4.0)
MCH: 33.5 pg (ref 26.0–34.0)
MCHC: 32.9 g/dL (ref 30.0–36.0)
MCV: 101.7 fL — ABNORMAL HIGH (ref 80.0–100.0)
Monocytes Absolute: 0.8 10*3/uL (ref 0.1–1.0)
Monocytes Relative: 10 %
Neutro Abs: 2.4 10*3/uL (ref 1.7–7.7)
Neutrophils Relative %: 32 %
Platelets: 263 10*3/uL (ref 150–400)
RBC: 4.09 MIL/uL — ABNORMAL LOW (ref 4.22–5.81)
RDW: 12.9 % (ref 11.5–15.5)
WBC: 7.5 10*3/uL (ref 4.0–10.5)
nRBC: 0 % (ref 0.0–0.2)

## 2019-03-11 MED ORDER — KETOROLAC TROMETHAMINE 30 MG/ML IJ SOLN
30.0000 mg | Freq: Once | INTRAMUSCULAR | Status: AC
  Administered 2019-03-11: 30 mg via INTRAVENOUS
  Filled 2019-03-11: qty 1

## 2019-03-11 MED ORDER — OXYCODONE-ACETAMINOPHEN 5-325 MG PO TABS: 1.0000 | ORAL_TABLET | ORAL | 0 refills | Status: DC | PRN

## 2019-03-11 MED ORDER — HYDROMORPHONE HCL 1 MG/ML IJ SOLN
0.5000 mg | Freq: Once | INTRAMUSCULAR | Status: AC
  Administered 2019-03-11: 0.5 mg via INTRAVENOUS
  Filled 2019-03-11: qty 1

## 2019-03-11 MED ORDER — ONDANSETRON HCL 4 MG/2ML IJ SOLN
4.0000 mg | Freq: Once | INTRAMUSCULAR | Status: AC
  Administered 2019-03-11: 05:00:00 4 mg via INTRAVENOUS
  Filled 2019-03-11: qty 2

## 2019-03-11 MED ORDER — ONDANSETRON 4 MG PO TBDP: 4.0000 mg | ORAL_TABLET | Freq: Three times a day (TID) | ORAL | 0 refills | Status: DC | PRN

## 2019-03-11 MED ORDER — TAMSULOSIN HCL 0.4 MG PO CAPS: 0.4000 mg | ORAL_CAPSULE | Freq: Every day | ORAL | 0 refills | Status: DC

## 2019-03-11 NOTE — ED Triage Notes (Signed)
Pt reports L flank pain and vomiting starting last night. Pain 10/10. Denies hx of kidney stones. He denies dysuria at this time.

## 2019-03-11 NOTE — Discharge Instructions (Signed)
As we discussed, you do have a kidney stone on the left causing your pain today. Hopefully this will pass in the next 48 hours or so.   Take the prescribed medication as directed to help control symptoms.  Do not drive, operate machinery, or make important decisions while taking the pain medication. Make sure to drink lots of water. Follow-up with urology-- can call their office to make appt (they may be closed today due to holiday). Return to the ED for new or worsening symptoms-- specifically any fever, uncontrolled nausea/vomiting, inability to urinate (especially if >12 hours), etc.

## 2019-03-11 NOTE — ED Provider Notes (Signed)
[facility] DEPT Provider Note   CSN: [id number] Arrival date & time: 03/11/19  0402     History Chief Complaint  Patient presents with  . Flank Pain    Stephen Davila is a 59 y.o. male.  The history is provided by the patient and medical records.  Flank Pain    59 year old male with history of COPD, hyperlipidemia hypertension, presenting to the ED with sudden onset left flank pain approximately 1 hour prior to arrival.  Pain is sharp and stabbing in nature with associated nausea and vomiting.  He denies any difficulty urinating or hematuria.  He has not had any fever or chills.  No cough, chest pain, shortness of breath.  No history of kidney stones in the past.  Past Medical History:  Diagnosis Date  . COPD (chronic obstructive pulmonary disease) ([place])   . Hyperlipidemia   . Hypertension     Patient Active Problem List   Diagnosis Date Noted  . Essential hypertension 07/02/2015  . Hypertriglyceridemia 01/19/2014  . Discoid eczema 03/30/2013  . Coronary atherosclerosis of native coronary artery 11/03/2012  . Hyperlipidemia with target LDL less than 100 11/03/2012  . Routine general medical examination at a health care facility 10/29/2011  . COPD GOLD I 06/20/2009    Past Surgical History:  Procedure Laterality Date  . CERVICAL SPINE SURGERY  10-01-12   5,6,7       Family History  Problem Relation Age of Onset  . Prostate cancer Father   . Heart disease Father   . Melanoma Sister   . Arthritis Mother   . Diabetes Neg Hx   . Stroke Neg Hx   . Kidney disease Neg Hx   . Hypertension Neg Hx   . Hyperlipidemia Neg Hx   . Colon cancer Neg Hx   . Pancreatic cancer Neg Hx   . Stomach cancer Neg Hx     Social History   Tobacco Use  . Smoking status: Former Smoker    Packs/day: 2.00    Years: 35.00    Pack years: 70.00    Quit date: 06/15/2009    Years since quitting: 9.7  . Smokeless tobacco: Never Used  Substance  Use Topics  . Alcohol use: Yes    Alcohol/week: 42.0 standard drinks    Types: 42 Cans of beer per week    Comment: socially   . Drug use: No    Home Medications Prior to Admission medications   Medication Sig Start Date End Date Taking? Authorizing Provider  doxycycline (VIBRAMYCIN) 100 MG capsule Take 1 capsule (100 mg total) by mouth 2 (two) times daily. 02/11/19   Carmin Muskrat, MD  fenofibrate micronized (LOFIBRA) 134 MG capsule TAKE 1 CAPSULE (134 MG TOTAL) BY MOUTH DAILY BEFORE BREAKFAST. 12/30/18   Nafziger, Tommi Rumps, NP  methylPREDNISolone (MEDROL) 4 MG tablet Medrol dose pack. Take as instructed 06/07/18   Mcarthur Rossetti, MD  pravastatin (PRAVACHOL) 40 MG tablet Take 1 tablet (40 mg total) by mouth daily. **DUE FOR YEARLY PHYSICAL** 02/23/19   Dorothyann Peng, NP  PROAIR HFA 108 [phone] Base) MCG/ACT inhaler TAKE 2 PUFFS BY MOUTH EVERY 6 HOURS AS NEEDED FOR WHEEZE 02/01/18   Janith Lima, MD  SYMBICORT 80-4.5 MCG/ACT inhaler INHALE 2 PUFFS BY MOUTH TWICE A DAY 05/12/18   Janith Lima, MD  telmisartan (MICARDIS) 40 MG tablet Take 1 tablet (40 mg total) by mouth daily. **DUE FOR YEARLY PHYSICAL** 03/08/19   Dorothyann Peng, NP  VASCEPA 1 g CAPS TAKE 2 CAPSULES TWICE A DAY 10/15/18   Croitoru, Dani Gobble, MD    Allergies    Patient has no known allergies.  Review of Systems   Review of Systems  Gastrointestinal: Positive for nausea and vomiting.  Genitourinary: Positive for flank pain.  All other systems reviewed and are negative.   Physical Exam Updated Vital Signs BP 137/73   Pulse 65   Temp 98.9 F (37.2 C) (Oral)   Resp 20   Ht 5\' 11"  (1.803 m)   Wt 70.3 kg   SpO2 99%   BMI 21.62 kg/m   Physical Exam Vitals and nursing note reviewed.  Constitutional:      Appearance: He is well-developed.     Comments: Appears uncomfortable  HENT:     Head: Normocephalic and atraumatic.  Eyes:     Conjunctiva/sclera: Conjunctivae normal.     Pupils: Pupils are equal,  round, and reactive to light.  Cardiovascular:     Rate and Rhythm: Normal rate and regular rhythm.     Heart sounds: Normal heart sounds.  Pulmonary:     Effort: Pulmonary effort is normal.     Breath sounds: Normal breath sounds.  Abdominal:     General: Bowel sounds are normal.     Palpations: Abdomen is soft.     Comments: Left lateral abdominal pain, no real focal tenderness, no peritoneal signs  Musculoskeletal:        General: Normal range of motion.     Cervical back: Normal range of motion.  Skin:    General: Skin is warm and dry.  Neurological:     Mental Status: He is alert and oriented to person, place, and time.     ED Results / Procedures / Treatments   Labs (all labs ordered are listed, but only abnormal results are displayed) Labs Reviewed  URINALYSIS, ROUTINE W REFLEX MICROSCOPIC - Abnormal; Notable for the following components:      Result Value   Hgb urine dipstick LARGE (*)    Bacteria, UA RARE (*)    All other components within normal limits  CBC WITH DIFFERENTIAL/PLATELET - Abnormal; Notable for the following components:   RBC 4.09 (*)    MCV 101.7 (*)    All other components within normal limits  BASIC METABOLIC PANEL - Abnormal; Notable for the following components:   Chloride 112 (*)    Glucose, Bld 104 (*)    Calcium 8.8 (*)    All other components within normal limits    EKG None  Radiology CT Renal Stone Study  Result Date: 03/11/2019 CLINICAL DATA:  Left flank pain. EXAM: CT ABDOMEN AND PELVIS WITHOUT CONTRAST TECHNIQUE: Multidetector CT imaging of the abdomen and pelvis was performed following the standard protocol without IV contrast. COMPARISON:  Remote CT 04/19/2003 FINDINGS: Lower chest: Emphysema areas of basilar scarring. No focal airspace disease. There are coronary artery calcifications. Hepatobiliary: No focal liver abnormality is seen. No gallstones, gallbladder wall thickening, or biliary dilatation. Pancreas: No ductal dilatation  or inflammation. Spleen: Normal in size without focal abnormality. Small splenule inferiorly. These are unchanged from prior. Adrenals/Urinary Tract: Similar left adrenal thickening without dominant nodule. Normal right adrenal gland. Single versus 2 adjacent stones in the left proximal ureter measuring 7 x 5 mm with mild hydronephrosis and perinephric edema. Additional nonobstructing stones in the left kidney. More distal ureter is decompressed. No right hydronephrosis or hydroureter. Calcifications at the right renal hilum may represent nonobstructing stones or  vascular calcifications. Partially distended urinary bladder without bladder wall thickening. No bladder stone. Stomach/Bowel: Nondistended stomach. No small bowel wall thickening, obstruction or inflammation. Normal appendix. Small to moderate colonic stool burden. Mild diverticulosis of the descending colon without diverticulitis. Vascular/Lymphatic: Moderate aorto bi-iliac atherosclerosis. No aortic aneurysm. Few prominent proximal celiac nodes are not enlarged by size criteria. Few prominent porta hepatis nodes. Reproductive: Prostate is unremarkable. Other: No ascites. No free air. Tiny fat containing umbilical hernia. Musculoskeletal: There are no acute or suspicious osseous abnormalities. IMPRESSION: 1. Obstructing single versus 2 adjacent stones in the left proximal ureter measuring 7 x 5 mm with mild hydronephrosis and perinephric edema. 2. Additional nonobstructing stones in the left kidney. Possible nonobstructing right renal stone versus vascular calcification. 3. Minimal colonic diverticulosis without diverticulitis. Aortic Atherosclerosis (ICD10-I70.0). Electronically Signed   By: Keith Rake M.D.   On: 03/11/2019 05:45    Procedures Procedures (including critical care time)  Medications Ordered in ED Medications  HYDROmorphone (DILAUDID) injection 0.5 mg (0.5 mg Intravenous Given 03/11/19 0439)  ondansetron (ZOFRAN) injection 4 mg  (4 mg Intravenous Given 03/11/19 0439)  HYDROmorphone (DILAUDID) injection 0.5 mg (0.5 mg Intravenous Given 03/11/19 0602)  ketorolac (TORADOL) 30 MG/ML injection 30 mg (30 mg Intravenous Given 03/11/19 0602)    ED Course  I have reviewed the triage vital signs and the nursing notes.  Pertinent labs & imaging results that were available during my care of the patient were reviewed by me and considered in my medical decision making (see chart for details).    MDM Rules/Calculators/A&P  59 y.o. M here with sudden onset left flank pain 1 hour PTA.  No history of kidney stones.  On exam patient is afebrile and nontoxic but he does appear uncomfortable and nauseated.  Suspect kidney stone.  UA has been sent.  Will obtain basic labs and CT renal stone study.  Patient given Dilaudid and Zofran.  6:01 AM On repeat assessment, patient appears much more comfortable.  Nausea has resolved.  Labs are reassuring.  CT with findings of stone in left proximal ureter with mild left hydronephrosis and perinephric edema.  Has additional stones in the left kidney.  Results discussed with patient, he states he is feeling better.  We have discussed expectant management.  He will need pain control and close outpatient follow-up with urology.  Continue good water intake.  He will return here for any new or acute changes, specifically any fever, uncontrolled pain/vomiting, inability to urinate, etc.  Final Clinical Impression(s) / ED Diagnoses Final diagnoses:  Left ureteral stone  Non-intractable vomiting with nausea, unspecified vomiting type  Hematuria, unspecified type    Rx / DC Orders ED Discharge Orders         Ordered    oxyCODONE-acetaminophen (PERCOCET) 5-325 MG tablet  Every 4 hours PRN     03/11/19 0614    ondansetron (ZOFRAN ODT) 4 MG disintegrating tablet  Every 8 hours PRN     03/11/19 0614    tamsulosin (FLOMAX) 0.4 MG CAPS capsule  Daily after supper     03/11/19 [id number]           Larene Pickett, PA-C 03/11/19 [id number]    Fatima Blank, MD 03/11/19 [phone]

## 2019-03-16 ENCOUNTER — Other Ambulatory Visit: Payer: Self-pay

## 2019-03-17 ENCOUNTER — Ambulatory Visit: Payer: BC Managed Care – PPO | Admitting: Adult Health

## 2019-03-17 ENCOUNTER — Encounter: Payer: Self-pay | Admitting: Adult Health

## 2019-03-17 VITALS — BP 128/78 | HR 78

## 2019-03-17 DIAGNOSIS — N200 Calculus of kidney: Secondary | ICD-10-CM

## 2019-03-17 MED ORDER — TAMSULOSIN HCL 0.4 MG PO CAPS: 0.4000 mg | ORAL_CAPSULE | Freq: Every day | ORAL | 0 refills | Status: DC

## 2019-03-17 NOTE — Progress Notes (Signed)
Subjective:    Patient ID: Stephen Davila, male    DOB: Aug 05, 1960, 59 y.o.   MRN: [id number]  HPI 59 year old male who  has a past medical history of COPD (chronic obstructive pulmonary disease) ([place]), Hyperlipidemia, and Hypertension.  Seen in the emergency room 6 days ago for sudden onset left flank pain.  Pain was sharp and stabbing in nature and associated with nausea and vomiting.  He denied difficulty with urinating or hematuria.  His CT showed   1. Obstructing single versus 2 adjacent stones in the left proximal ureter measuring 7 x 5 mm with mild hydronephrosis and perinephric edema. 2. Additional nonobstructing stones in the left kidney. Possible nonobstructing right renal stone versus vascular calcification. 3. Minimal colonic diverticulosis without diverticulitis.  Pain control and close outpatient follow-up with urology was instructed.  He was given Percocet and Flomax.  Today he reports that he continues to have left flank pain.  Pain is somewhat controlled with Percocet but he does not like taking it for the way it makes him feel.  He is taking anti-inflammatories which help but not all the time.  He has skipped a couple days of Flomax.  Has not called for an appointment with urology yet  Here denies passing any stones, has had some mild decreased stream earlier today.  Denies hematuria   Review of Systems See HPI   Past Medical History:  Diagnosis Date  . COPD (chronic obstructive pulmonary disease) ([place])   . Hyperlipidemia   . Hypertension     Social History   Socioeconomic History  . Marital status: Married    Spouse name: Not on file  . Number of children: Not on file  . Years of education: Not on file  . Highest education level: Not on file  Occupational History  . Not on file  Tobacco Use  . Smoking status: Former Smoker    Packs/day: 2.00    Years: 35.00    Pack years: 70.00    Quit date: 06/15/2009    Years since quitting: 9.7  .  Smokeless tobacco: Never Used  Substance and Sexual Activity  . Alcohol use: Yes    Alcohol/week: 42.0 standard drinks    Types: 42 Cans of beer per week    Comment: socially   . Drug use: No  . Sexual activity: Yes  Other Topics Concern  . Not on file  Social History Narrative   [name] for 20 years    Married    No kids    Social Determinants of Health   Financial Resource Strain:   . Difficulty of Paying Living Expenses: Not on file  Food Insecurity:   . Worried About Charity fundraiser in the Last Year: Not on file  . Ran Out of Food in the Last Year: Not on file  Transportation Needs:   . Lack of Transportation (Medical): Not on file  . Lack of Transportation (Non-Medical): Not on file  Physical Activity:   . Days of Exercise per Week: Not on file  . Minutes of Exercise per Session: Not on file  Stress:   . Feeling of Stress : Not on file  Social Connections:   . Frequency of Communication with Friends and Family: Not on file  . Frequency of Social Gatherings with Friends and Family: Not on file  . Attends Religious Services: Not on file  . Active Member of Clubs or Organizations: Not on file  . Attends Club or  Organization Meetings: Not on file  . Marital Status: Not on file  Intimate Partner Violence:   . Fear of Current or Ex-Partner: Not on file  . Emotionally Abused: Not on file  . Physically Abused: Not on file  . Sexually Abused: Not on file    Past Surgical History:  Procedure Laterality Date  . CERVICAL SPINE SURGERY  10-01-12   5,6,7    Family History  Problem Relation Age of Onset  . Prostate cancer Father   . Heart disease Father   . Melanoma Sister   . Arthritis Mother   . Diabetes Neg Hx   . Stroke Neg Hx   . Kidney disease Neg Hx   . Hypertension Neg Hx   . Hyperlipidemia Neg Hx   . Colon cancer Neg Hx   . Pancreatic cancer Neg Hx   . Stomach cancer Neg Hx     No Known Allergies  Current Outpatient Medications on File Prior to  Visit  Medication Sig Dispense Refill  . doxycycline (VIBRAMYCIN) 100 MG capsule Take 1 capsule (100 mg total) by mouth 2 (two) times daily. 20 capsule 0  . fenofibrate micronized (LOFIBRA) 134 MG capsule TAKE 1 CAPSULE (134 MG TOTAL) BY MOUTH DAILY BEFORE BREAKFAST. 90 capsule 0  . methylPREDNISolone (MEDROL) 4 MG tablet Medrol dose pack. Take as instructed 21 tablet 0  . ondansetron (ZOFRAN ODT) 4 MG disintegrating tablet Take 1 tablet (4 mg total) by mouth every 8 (eight) hours as needed for nausea. 10 tablet 0  . oxyCODONE-acetaminophen (PERCOCET) 5-325 MG tablet Take 1 tablet by mouth every 4 (four) hours as needed. 20 tablet 0  . pravastatin (PRAVACHOL) 40 MG tablet Take 1 tablet (40 mg total) by mouth daily. **DUE FOR YEARLY PHYSICAL** 30 tablet 0  . PROAIR HFA 108 (90 Base) MCG/ACT inhaler TAKE 2 PUFFS BY MOUTH EVERY 6 HOURS AS NEEDED FOR WHEEZE 8.5 Inhaler 2  . SYMBICORT 80-4.5 MCG/ACT inhaler INHALE 2 PUFFS BY MOUTH TWICE A DAY 10.2 Inhaler 5  . tamsulosin (FLOMAX) 0.4 MG CAPS capsule Take 1 capsule (0.4 mg total) by mouth daily after supper. 10 capsule 0  . telmisartan (MICARDIS) 40 MG tablet Take 1 tablet (40 mg total) by mouth daily. **DUE FOR YEARLY PHYSICAL** 30 tablet 0  . VASCEPA 1 g CAPS TAKE 2 CAPSULES TWICE A DAY 360 capsule 1   No current facility-administered medications on file prior to visit.    BP 128/78 (BP Location: Left Arm, Patient Position: Sitting, Cuff Size: Normal)   Pulse 78   SpO2 98%       Objective:   Physical Exam Vitals and nursing note reviewed.  Constitutional:      Appearance: Normal appearance.  Skin:    General: Skin is warm and dry.  Neurological:     General: No focal deficit present.     Mental Status: He is alert and oriented to person, place, and time.  Psychiatric:        Mood and Affect: Mood normal.        Behavior: Behavior normal.        Thought Content: Thought content normal.        Judgment: Judgment normal.         Assessment & Plan:  1. Kidney stone - Ambulatory referral to Urology - tamsulosin (FLOMAX) 0.4 MG CAPS capsule; Take 1 capsule (0.4 mg total) by mouth daily after supper.  Dispense: 90 capsule; Refill: 0 - Continue with anti-inflammatories and  can alternate with Tylenol.  Percocet for breakthrough pain.  Stay well-hydrated.  Follow-up as needed  Dorothyann Peng, NP

## 2019-03-21 ENCOUNTER — Other Ambulatory Visit: Payer: Self-pay | Admitting: Urology

## 2019-03-21 ENCOUNTER — Other Ambulatory Visit: Payer: Self-pay

## 2019-03-21 ENCOUNTER — Ambulatory Visit (HOSPITAL_COMMUNITY): Payer: BC Managed Care – PPO

## 2019-03-21 ENCOUNTER — Ambulatory Visit (HOSPITAL_COMMUNITY): Payer: BC Managed Care – PPO | Admitting: Anesthesiology

## 2019-03-21 ENCOUNTER — Ambulatory Visit (HOSPITAL_COMMUNITY)
Admission: RE | Admit: 2019-03-21 | Discharge: 2019-03-21 | Disposition: A | Discharge status: Home / Self Care | Payer: BC Managed Care – PPO | Source: Other Acute Inpatient Hospital | Attending: Urology | Admitting: Urology

## 2019-03-21 ENCOUNTER — Encounter (HOSPITAL_COMMUNITY)
Admission: RE | Disposition: A | Discharge status: Home / Self Care | Payer: Self-pay | Source: Other Acute Inpatient Hospital | Attending: Urology

## 2019-03-21 ENCOUNTER — Encounter (HOSPITAL_COMMUNITY): Payer: Self-pay | Admitting: Urology

## 2019-03-21 DIAGNOSIS — N201 Calculus of ureter: Secondary | ICD-10-CM | POA: Diagnosis not present

## 2019-03-21 DIAGNOSIS — E7800 Pure hypercholesterolemia, unspecified: Secondary | ICD-10-CM | POA: Diagnosis not present

## 2019-03-21 DIAGNOSIS — Z7951 Long term (current) use of inhaled steroids: Secondary | ICD-10-CM | POA: Insufficient documentation

## 2019-03-21 DIAGNOSIS — I2510 Atherosclerotic heart disease of native coronary artery without angina pectoris: Secondary | ICD-10-CM | POA: Insufficient documentation

## 2019-03-21 DIAGNOSIS — Z79899 Other long term (current) drug therapy: Secondary | ICD-10-CM | POA: Diagnosis not present

## 2019-03-21 DIAGNOSIS — J449 Chronic obstructive pulmonary disease, unspecified: Secondary | ICD-10-CM | POA: Diagnosis not present

## 2019-03-21 DIAGNOSIS — I10 Essential (primary) hypertension: Secondary | ICD-10-CM | POA: Insufficient documentation

## 2019-03-21 DIAGNOSIS — C676 Malignant neoplasm of ureteric orifice: Secondary | ICD-10-CM | POA: Insufficient documentation

## 2019-03-21 DIAGNOSIS — Z87891 Personal history of nicotine dependence: Secondary | ICD-10-CM | POA: Insufficient documentation

## 2019-03-21 HISTORY — PX: CYSTOSCOPY/URETEROSCOPY/HOLMIUM LASER/STENT PLACEMENT: SHX6546

## 2019-03-21 HISTORY — DX: Other complications of anesthesia, initial encounter: T88.59XA

## 2019-03-21 SURGERY — CYSTOSCOPY/URETEROSCOPY/HOLMIUM LASER/STENT PLACEMENT
Anesthesia: General

## 2019-03-21 MED ORDER — OXYCODONE-ACETAMINOPHEN 5-325 MG PO TABS: 1.0000 | ORAL_TABLET | ORAL | 0 refills | Status: DC | PRN

## 2019-03-21 MED ORDER — ONDANSETRON HCL 4 MG/2ML IJ SOLN
INTRAMUSCULAR | Status: AC
  Filled 2019-03-21: qty 2

## 2019-03-21 MED ORDER — FENTANYL CITRATE (PF) 100 MCG/2ML IJ SOLN
INTRAMUSCULAR | Status: AC
  Filled 2019-03-21: qty 2

## 2019-03-21 MED ORDER — LIDOCAINE 2% (20 MG/ML) 5 ML SYRINGE
INTRAMUSCULAR | Status: AC
  Filled 2019-03-21: qty 5

## 2019-03-21 MED ORDER — ONDANSETRON HCL 4 MG/2ML IJ SOLN: 4.0000 mg | Freq: Once | INTRAMUSCULAR | Status: DC | PRN

## 2019-03-21 MED ORDER — OXYCODONE HCL 5 MG PO TABS
ORAL_TABLET | ORAL | Status: AC
  Filled 2019-03-21: qty 1

## 2019-03-21 MED ORDER — LACTATED RINGERS IV SOLN: INTRAVENOUS | Status: DC

## 2019-03-21 MED ORDER — PROPOFOL 10 MG/ML IV BOLUS
INTRAVENOUS | Status: AC
  Filled 2019-03-21: qty 20

## 2019-03-21 MED ORDER — CEFAZOLIN SODIUM-DEXTROSE 2-4 GM/100ML-% IV SOLN
2.0000 g | INTRAVENOUS | Status: AC
  Administered 2019-03-21: 2 g via INTRAVENOUS

## 2019-03-21 MED ORDER — MEPERIDINE HCL 50 MG/ML IJ SOLN: 6.2500 mg | INTRAMUSCULAR | Status: DC | PRN

## 2019-03-21 MED ORDER — OXYCODONE HCL 5 MG/5ML PO SOLN: 5.0000 mg | Freq: Once | ORAL | Status: AC | PRN

## 2019-03-21 MED ORDER — FENTANYL CITRATE (PF) 100 MCG/2ML IJ SOLN
25.0000 ug | INTRAMUSCULAR | Status: DC | PRN
  Administered 2019-03-21: 50 ug via INTRAVENOUS

## 2019-03-21 MED ORDER — SODIUM CHLORIDE 0.9 % IR SOLN
Status: DC | PRN
  Administered 2019-03-21: 6000 mL via INTRAVESICAL

## 2019-03-21 MED ORDER — DEXAMETHASONE SODIUM PHOSPHATE 10 MG/ML IJ SOLN
INTRAMUSCULAR | Status: AC
  Filled 2019-03-21: qty 1

## 2019-03-21 MED ORDER — ONDANSETRON HCL 4 MG/2ML IJ SOLN
INTRAMUSCULAR | Status: DC | PRN
  Administered 2019-03-21: 4 mg via INTRAVENOUS

## 2019-03-21 MED ORDER — LIDOCAINE 2% (20 MG/ML) 5 ML SYRINGE
INTRAMUSCULAR | Status: DC | PRN
  Administered 2019-03-21: 50 mg via INTRAVENOUS

## 2019-03-21 MED ORDER — FENTANYL CITRATE (PF) 100 MCG/2ML IJ SOLN
INTRAMUSCULAR | Status: DC | PRN
  Administered 2019-03-21 (×4): 25 ug via INTRAVENOUS

## 2019-03-21 MED ORDER — DEXAMETHASONE SODIUM PHOSPHATE 10 MG/ML IJ SOLN
INTRAMUSCULAR | Status: DC | PRN
  Administered 2019-03-21: 8 mg via INTRAVENOUS

## 2019-03-21 MED ORDER — IOHEXOL 300 MG/ML  SOLN
INTRAMUSCULAR | Status: DC | PRN
  Administered 2019-03-21: 14 mL via URETHRAL

## 2019-03-21 MED ORDER — PROPOFOL 10 MG/ML IV BOLUS
INTRAVENOUS | Status: DC | PRN
  Administered 2019-03-21: 180 mg via INTRAVENOUS

## 2019-03-21 MED ORDER — OXYCODONE HCL 5 MG PO TABS
5.0000 mg | ORAL_TABLET | Freq: Once | ORAL | Status: AC | PRN
  Administered 2019-03-21: 5 mg via ORAL

## 2019-03-21 MED ORDER — TAMSULOSIN HCL 0.4 MG PO CAPS: 0.4000 mg | ORAL_CAPSULE | Freq: Every day | ORAL | 1 refills | Status: DC

## 2019-03-21 MED ORDER — CEFAZOLIN SODIUM-DEXTROSE 2-4 GM/100ML-% IV SOLN
INTRAVENOUS | Status: AC
  Filled 2019-03-21: qty 100

## 2019-03-21 SURGICAL SUPPLY — 21 items
BAG URO CATCHER STRL LF (MISCELLANEOUS) ×2 IMPLANT
BASKET LASER NITINOL 1.9FR (BASKET) IMPLANT
BASKET ZERO TIP NITINOL 2.4FR (BASKET) IMPLANT
CATH INTERMIT  6FR 70CM (CATHETERS) ×2 IMPLANT
CLOTH BEACON ORANGE TIMEOUT ST (SAFETY) ×2 IMPLANT
EXTRACTOR STONE 1.7FRX115CM (UROLOGICAL SUPPLIES) IMPLANT
FIBER LASER FLEXIVA 365 (UROLOGICAL SUPPLIES) IMPLANT
FIBER LASER TRAC TIP (UROLOGICAL SUPPLIES) ×2 IMPLANT
GLOVE BIO SURGEON STRL SZ7.5 (GLOVE) ×6 IMPLANT
GOWN STRL REUS W/TWL XL LVL3 (GOWN DISPOSABLE) ×6 IMPLANT
GUIDEWIRE ANG ZIPWIRE 038X150 (WIRE) IMPLANT
GUIDEWIRE STR DUAL SENSOR (WIRE) ×4 IMPLANT
KIT TURNOVER KIT A (KITS) ×2 IMPLANT
LOOP CUT BIPOLAR 24F LRG (ELECTROSURGICAL) ×2 IMPLANT
MANIFOLD NEPTUNE II (INSTRUMENTS) ×2 IMPLANT
PACK CYSTO (CUSTOM PROCEDURE TRAY) ×2 IMPLANT
SHEATH URETERAL 12FRX28CM (UROLOGICAL SUPPLIES) IMPLANT
SHEATH URETERAL 12FRX35CM (MISCELLANEOUS) IMPLANT
STENT CONTOUR 6FRX26X.038 (STENTS) ×4 IMPLANT
TUBING CONNECTING 10 (TUBING) ×2 IMPLANT
TUBING UROLOGY SET (TUBING) ×2 IMPLANT

## 2019-03-21 NOTE — Anesthesia Procedure Notes (Signed)
Procedure Name: LMA Insertion Date/Time: 03/21/2019 3:43 PM Performed by: Anne Fu, CRNA Pre-anesthesia Checklist: Patient identified, Emergency Drugs available, Suction available, Patient being monitored and Timeout performed Patient Re-evaluated:Patient Re-evaluated prior to induction Oxygen Delivery Method: Circle system utilized Preoxygenation: Pre-oxygenation with 100% oxygen Induction Type: IV induction Ventilation: Mask ventilation without difficulty LMA: LMA inserted LMA Size: 4.0 Number of attempts: 1 Placement Confirmation: positive ETCO2 and breath sounds checked- equal and bilateral Tube secured with: Tape

## 2019-03-21 NOTE — H&P (Signed)
CC/HPI: Left ureteral calculus  HPI:  03/21/2019  59 year old male presents with a 10 day history of left flank pain. He went to the emergency department on new year's Day. CT scan was performed that revealed a proximal 7 mm ureteral calculus. There was upstream hydronephrosis. Stone was not visible on [facility]. He continues to have left-sided flank pain and microscopic hematuria. He has not passed the stone. Stone is not visible on KUB today. He denies any fever, nausea, vomiting. He continues to require pain medication. He states his primary care physician is checking his PSA and prostate.     ALLERGIES: No Known Drug Allergies    MEDICATIONS: No Medications    GU PSH: None   NON-GU PSH: Neck Spine Fusion     GU PMH: None     PMH Notes:  1898-03-10 00:00:00 - Note: Normal Routine History And Physical Adult   NON-GU PMH: Hypercholesterolemia Hypertension    FAMILY HISTORY: Prostate Cancer - Father   SOCIAL HISTORY: Marital Status: Married Preferred Language: English Does not drink caffeine.    REVIEW OF SYSTEMS:    GU Review Male:   Patient denies frequent urination, hard to postpone urination, burning/ pain with urination, get up at night to urinate, leakage of urine, stream starts and stops, trouble starting your stream, have to strain to urinate , erection problems, and penile pain.  Gastrointestinal (Upper):   Patient reports nausea and vomiting. Patient denies indigestion/ heartburn.  Gastrointestinal (Lower):   Patient reports constipation. Patient denies diarrhea.  Constitutional:   Patient denies fever, night sweats, weight loss, and fatigue.  Skin:   Patient reports itching. Patient denies skin rash/ lesion.  Eyes:   Patient denies blurred vision and double vision.  Ears/ Nose/ Throat:   Patient denies sore throat and sinus problems.  Hematologic/Lymphatic:   Patient denies swollen glands and easy bruising.  Cardiovascular:   Patient denies leg swelling and  chest pains.  Respiratory:   Patient denies cough and shortness of breath.  Endocrine:   Patient denies excessive thirst.  Musculoskeletal:   Patient denies back pain and joint pain.  Neurological:   Patient denies headaches and dizziness.  Psychologic:   Patient denies depression and anxiety.   VITAL SIGNS:      03/21/2019 09:17 AM  Weight 160 lb / 72.57 kg  Height 71 in / 180.34 cm  BP 145/75 mmHg  Heart Rate 67 /min  Temperature 97.5 F / 36.3 C  BMI 22.3 kg/m   MULTI-SYSTEM PHYSICAL EXAMINATION:    Constitutional: Well-nourished. No physical deformities. Normally developed. Good grooming.  Respiratory: No labored breathing, no use of accessory muscles.   Cardiovascular: Normal temperature, adequate perfusion of extremities  Skin: No paleness, no jaundice  Neurologic / Psychiatric: Oriented to time, oriented to place, oriented to person. No depression, no anxiety, no agitation.  Gastrointestinal: No mass, no tenderness, no rigidity, non obese abdomen.  Eyes: Normal conjunctivae. Normal eyelids.  Musculoskeletal: Normal gait and station of head and neck.     PAST DATA REVIEWED:  Source Of History:  Patient  Records Review:   Previous Doctor Records, Previous Patient Records  X-Ray Review: C.T. Abdomen/Pelvis: Reviewed Films. Reviewed Report. Discussed With Patient.     PROCEDURES:         KUB - [id number]  A single view of the abdomen is obtained.  Stone is not visible on KUB      Patient confirmed No Neulasta OnPro Device.  Urinalysis w/Scope Dipstick Dipstick Cont'd Micro  Color: Yellow Bilirubin: Neg mg/dL WBC/hpf: NS (Not Seen)  Appearance: Clear Ketones: Neg mg/dL RBC/hpf: 3 - 10/hpf  Specific Gravity: 1.020 Blood: 3+ ery/uL Bacteria: NS (Not Seen)  pH: <=5.0 Protein: Neg mg/dL Cystals: NS (Not Seen)  Glucose: Neg mg/dL Urobilinogen: 0.2 mg/dL Casts: NS (Not Seen)    Nitrites: Neg Trichomonas: Not Present    Leukocyte Esterase: Neg leu/uL Mucous: Not  Present      Epithelial Cells: NS (Not Seen)      Yeast: NS (Not Seen)      Sperm: Not Present    ASSESSMENT:      ICD-10 Details  1 GU:   Ureteral calculus - N20.1 Undiagnosed New Problem  2   Ureteral obstruction secondary to calculous - N13.2 Undiagnosed New Problem  3   Renal colic - [id number] Undiagnosed New Problem   PLAN:           Orders X-Rays: KUB          Schedule         Document Letter(s):  Created for Patient: Clinical Summary         Notes:   He would like to proceed with ureteroscopy with laser lithotripsy and ureteral stent placement. Risks and benefits discussed including but not limited to bleeding, infection, injury to surrounding structures including potential for ureteral avulsion, need for additional procedures.   CC: Dorothyann Peng     Signed by Link Snuffer, III, M.D. on 03/21/19 at 9:49 AM (EST

## 2019-03-21 NOTE — Discharge Instructions (Addendum)
[facility] ?[phone] ?Post Ureteroscopy With or Without Stent Instructions ? ?Definitions: ? ?Ureter: The duct that transports urine from the kidney to the bladder. ?Stent:   A plastic hollow tube that is placed into the ureter, from the kidney to the                 bladder to prevent the ureter from swelling shut. ? ?GENERAL INSTRUCTIONS: ? ?Despite the fact that no skin incisions were used, the area around the ureter and bladder is raw and irritated. The stent is a foreign body which will further irritate the bladder wall. This irritation is manifested by increased frequency of urination, both day and night, and by an increase in the urge to urinate. In some, the urge to urinate is present almost always. Sometimes the urge is strong enough that you may not be able to stop yourself from urinating. The only real cure is to remove the stent and then give time for the bladder wall to heal which can't be done until the danger of the ureter swelling shut has passed, which varies. ? ?You may see some blood in your urine while the stent is in place and a few days afterwards. Do not be alarmed, even if the urine was clear for a while. Get off your feet and drink lots of fluids until clearing occurs. If you start to pass clots or don't improve, call us. ? ?DIET: ?You may return to your normal diet immediately. Because of the raw surface of your bladder, alcohol, spicy foods, acid type foods and drinks with caffeine may cause irritation or frequency and should be used in moderation. To keep your urine flowing freely and to avoid constipation, drink plenty of fluids during the day ( 8-10 glasses ). ?Tip: Avoid cranberry juice because it is very acidic. ? ?ACTIVITY: ?Your physical activity doesn't need to be restricted. However, if you are very active, you may see some blood in your urine. We suggest that you reduce your activity under these circumstances until the bleeding has stopped. ? ?BOWELS: ?It is  important to keep your bowels regular during the postoperative period. Straining with bowel movements can cause bleeding. A bowel movement every other day is reasonable. Use a mild laxative if needed, such as Milk of Magnesia 2-3 tablespoons, or 2 Dulcolax tablets. Call if you continue to have problems. If you have been taking narcotics for pain, before, during or after your surgery, you may be constipated. Take a laxative if necessary. ? ? ?MEDICATION: ?You should resume your pre-surgery medications unless told not to. ?You may take oxybutynin or flomax if prescribed for bladder spasms or discomfort from the stent ?Take pain medication as directed for pain refractory to conservative management ? ?PROBLEMS YOU SHOULD REPORT TO US: ?Fevers over 100.5 Fahrenheit. ?Heavy bleeding, or clots ( See above notes about blood in urine ). ?Inability to urinate. ?Drug reactions ( hives, rash, nausea, vomiting, diarrhea ). ?Severe burning or pain with urination that is not improving. ? ?Transurethral Resection of Bladder Tumor (TURBT) or Bladder Biopsy ? ? ?Definition: ? Transurethral Resection of the Bladder Tumor is a surgical procedure used to diagnose and remove tumors within the bladder. TURBT is the most common treatment for early stage bladder cancer. ? ?General instructions: ?   ? Your recent bladder surgery requires very little post hospital care but some definite precautions. ? ?Despite the fact that no skin incisions were used, the area around the bladder incisions are raw and   covered with scabs to promote healing and prevent bleeding. Certain precautions are needed to insure that the scabs are not disturbed over the next 2-4 weeks while the healing proceeds. ? ?Because the raw surface inside your bladder and the irritating effects of urine you may expect frequency of urination and/or urgency (a stronger desire to urinate) and perhaps even getting up at night more often. This will usually resolve or improve slowly  over the healing period. You may see some blood in your urine over the first 6 weeks. Do not be alarmed, even if the urine was clear for a while. Get off your feet and drink lots of fluids until clearing occurs. If you start to pass clots or don't improve call us. ? ?Diet: ? ?You may return to your normal diet immediately. Because of the raw surface of your bladder, alcohol, spicy foods, foods high in acid and drinks with caffeine may cause irritation or frequency and should be used in moderation. To keep your urine flowing freely and avoid constipation, drink plenty of fluids during the day (8-10 glasses). Tip: Avoid cranberry juice because it is very acidic. ? ?Activity: ? ?Your physical activity doesn't need to be restricted. However, if you are very active, you may see some blood in the urine. We suggest that you reduce your activity under the circumstances until the bleeding has stopped. ? ?Bowels: ? ?It is important to keep your bowels regular during the postoperative period. Straining with bowel movements can cause bleeding. A bowel movement every other day is reasonable. Use a mild laxative if needed, such as milk of magnesia 2-3 tablespoons, or 2 Dulcolax tablets. Call if you continue to have problems. If you had been taking narcotics for pain, before, during or after your surgery, you may be constipated. Take a laxative if necessary. ? ? ? ?Medication: ? ?You should resume your pre-surgery medications unless told not to. In addition you may be given an antibiotic to prevent or treat infection. Antibiotics are not always necessary. All medication should be taken as prescribed until the bottles are finished unless you are having an unusual reaction to one of the drugs. ? ? ?

## 2019-03-21 NOTE — Anesthesia Postprocedure Evaluation (Signed)
Anesthesia Post Note  Patient: Stephen Davila  Procedure(s) Performed: CYSTOSCOPYl/LEFT URETEROSCOPY/HOLMIUM LASER/RETROGRADE/BILATERAL STENT PLACEMENT/BLADDER RESECTION/RIGHT RETROGRADE (N/A )     Patient location during evaluation: PACU Anesthesia Type: General Level of consciousness: awake and alert and oriented Pain management: pain level controlled Vital Signs Assessment: post-procedure vital signs reviewed and stable Respiratory status: spontaneous breathing, nonlabored ventilation and respiratory function stable Cardiovascular status: blood pressure returned to baseline and stable Postop Assessment: no apparent nausea or vomiting Anesthetic complications: no    Last Vitals:  Vitals:   03/21/19 1645 03/21/19 1700  BP: (!) 154/72 (!) 158/81  Pulse: 86 76  Resp: 17 11  Temp:    SpO2: 100% 94%    Last Pain:  Vitals:   03/21/19 1700  TempSrc:   PainSc: 8                  Janira Mandell A.

## 2019-03-21 NOTE — Transfer of Care (Signed)
Immediate Anesthesia Transfer of Care Note  Patient: Saron Dofflemyer Hally  Procedure(s) Performed: Procedure(s): CYSTOSCOPYl/LEFT URETEROSCOPY/HOLMIUM LASER/RETROGRADE/BILATERAL STENT PLACEMENT/BLADDER RESECTION/RIGHT RETROGRADE (N/A)  Patient Location: PACU  Anesthesia Type:General  Level of Consciousness:  sedated, patient cooperative and responds to stimulation  Airway & Oxygen Therapy:Patient Spontanous Breathing and Patient connected to face mask oxgen  Post-op Assessment:  Report given to PACU RN and Post -op Vital signs reviewed and stable  Post vital signs:  Reviewed and stable  Last Vitals:  Vitals:   03/21/19 1420 03/21/19 1641  BP: (!) 144/95   Pulse: 76   Resp: 18   Temp: 37.1 C (P) 37 C  SpO2: 123456     Complications: No apparent anesthesia complications

## 2019-03-21 NOTE — Op Note (Signed)
Operative Note  Preoperative diagnosis:  1.  Left ureteral calculus  Postoperative diagnosis: 1.  Left ureteral calculus 2.  Medium bladder tumor  Procedure(s): 1.  Cystoscopy with bilateral retrograde pyelogram, left ureteroscopy with laser lithotripsy and stone basketing, left ureteral stent placement, right ureteral stent placement 2.  Transurethral resection of bladder tumor--medium  Surgeon: Link Snuffer, MD  Assistants: None  Anesthesia: General  Complications: None immediate  EBL: Minimal  Specimens: 1.  Bladder tumor  Drains/Catheters: 1.  Bilateral 6 x 26 double-J ureteral stents  Intraoperative findings: 1.  Normal anterior urethra 2.  Borderline obstructing prostate 3.  Left retrograde pyelogram revealed some fullness of the kidney consistent with obstructive process.  Ureteroscopy revealed a 7 mm calculus that was fragmented and basket extracted. 4.  2 cm bladder tumor incidentally found that appeared to be completely resected.  It overlie the right ureteral orifice.  Right retrograde pyelogram revealed no obvious filling defect but there were some air bubbles within the collecting system from the retrograde pyelogram.  Ureteral stent was placed since the tumor was resected over the ureteral orifice.  No other bladder tumors.  Indication: 59 year old male with a left ureteral calculus who failed trial passage presents for the previously mentioned operation.  Description of procedure:  The patient was identified and consent was obtained.  The patient was taken to the operating room and placed in the supine position.  The patient was placed under general anesthesia.  Perioperative antibiotics were administered.  The patient was placed in dorsal lithotomy.  Patient was prepped and draped in a standard sterile fashion and a timeout was performed.  A 21 French rigid cystoscope was advanced into the urethra and into the bladder.  Complete cystoscopy was performed with the  findings noted above.  I did note a 2 cm bladder tumor overlying the right ureteral orifice.  I first turned my attention to the left ureteral orifice and advanced a sensor wire up into the kidney under fluoroscopic guidance.  A semirigid ureteroscope was advanced alongside the wire up to the stone of interest which was fragmented to smaller fragments followed by basket extraction.  Advance the scope up to the renal pelvis and no other stone fragments were seen.  There were no clinically significant stone fragments remaining.  I shot a retrograde pyelogram through the scope with the findings noted above.  I withdrew the scope visualizing the entire ureter upon removal.  There is no evidence of significant injury.  I backloaded the wire onto a rigid cystoscope and advanced that into the bladder followed by routine placement of a 6 x 26 double-J ureteral stent.  Fluoroscopy confirmed proximal placement and direct visualization confirmed a good coil within the bladder.  I then turned my attention to the right ureteral orifice.  It was not readily visible.  However, I was able to navigate a sensor wire into the ureter.  I advanced an open-ended ureteral catheter over the wire.  I shot a retrograde pyelogram with findings noted above.  There was no obvious hydronephrosis.  There was no obvious tumor.  However there was some limitation due to air bubbles.  I carefully advanced the open-ended ureteral catheter further into the ureter.  I then withdrew the scope keeping the open-ended ureteral cath within the ureter.  I advanced a 56 French resectoscope with the visual obturator in place into the urethra and into the bladder.  This was exchanged for the bipolar working element.  I proceeded to resect the  tumor of interest.  It appeared completely resected.  I kept the open-ended ureteral catheter within the ureter to keep it from being damaged during resection and fulguration.  I collected the bladder tumor for  specimen.  Once all the bladder tumor was resected and the bladder bed fulgurated, I reinspected the entire bladder mucosa and there were no other tumors seen.  I withdrew the scope keeping the open-ended ureteral catheter in place.  I advanced a sensor wire through the open-ended ureteral catheter up into the kidney under fluoroscopic guidance followed by removal of the open-ended ureteral catheter.  I backloaded the wire onto a rigid cystoscope and advanced that into the bladder followed by routine placement of a 6 x 26 double-J ureteral stent.  Fluoroscopy confirmed proximal placement and direct visualization confirmed a good coil within the bladder.  I again noted that there was no active bleeding.  I drained the bladder and withdrew the scope.  Patient tolerated procedure well was stable postoperative.  Plan: Follow-up in 1 week for pathology review and removal of stents

## 2019-03-21 NOTE — Anesthesia Preprocedure Evaluation (Signed)
Anesthesia Evaluation  Patient identified by MRN, date of birth, ID band Patient awake    Reviewed: Allergy & Precautions, NPO status , Patient's Chart, lab work & pertinent test results  History of Anesthesia Complications (+) history of anesthetic complications  Airway Mallampati: II  TM Distance: >3 FB Neck ROM: Full    Dental  (+) Teeth Intact   Pulmonary COPD,  COPD inhaler, former smoker,    Pulmonary exam normal breath sounds clear to auscultation       Cardiovascular hypertension, Pt. on medications + CAD  Normal cardiovascular exam Rhythm:Regular Rate:Normal     Neuro/Psych negative neurological ROS  negative psych ROS   GI/Hepatic negative GI ROS, (+)     substance abuse  alcohol use,   Endo/Other  Hyperlipidemia  Renal/GU Renal diseaseLeft ureteral calculus with obstruction of left ureter  negative genitourinary   Musculoskeletal negative musculoskeletal ROS (+)   Abdominal   Peds  Hematology negative hematology ROS (+)   Anesthesia Other Findings   Reproductive/Obstetrics                             Anesthesia Physical Anesthesia Plan  ASA: II  Anesthesia Plan: General   Post-op Pain Management:    Induction: Intravenous  PONV Risk Score and Plan: 3 and Midazolam, Dexamethasone, Ondansetron and Treatment may vary due to age or medical condition  Airway Management Planned: LMA  Additional Equipment:   Intra-op Plan:   Post-operative Plan: Extubation in OR  Informed Consent: I have reviewed the patients History and Physical, chart, labs and discussed the procedure including the risks, benefits and alternatives for the proposed anesthesia with the patient or authorized representative who has indicated his/her understanding and acceptance.     Dental advisory given  Plan Discussed with: CRNA and Surgeon  Anesthesia Plan Comments:         Anesthesia  Quick Evaluation

## 2019-03-23 LAB — SURGICAL PATHOLOGY

## 2019-03-24 ENCOUNTER — Other Ambulatory Visit: Payer: Self-pay | Admitting: Adult Health

## 2019-04-08 ENCOUNTER — Other Ambulatory Visit: Payer: Self-pay

## 2019-04-13 ENCOUNTER — Other Ambulatory Visit: Payer: Self-pay | Admitting: Adult Health

## 2019-04-13 ENCOUNTER — Other Ambulatory Visit: Payer: Self-pay | Admitting: Adult Medicine

## 2019-04-13 DIAGNOSIS — I2510 Atherosclerotic heart disease of native coronary artery without angina pectoris: Secondary | ICD-10-CM

## 2019-04-13 DIAGNOSIS — I10 Essential (primary) hypertension: Secondary | ICD-10-CM

## 2019-04-13 DIAGNOSIS — N6320 Unspecified lump in the left breast, unspecified quadrant: Secondary | ICD-10-CM

## 2019-04-20 ENCOUNTER — Other Ambulatory Visit: Payer: Self-pay

## 2019-04-21 ENCOUNTER — Ambulatory Visit: Payer: BC Managed Care – PPO | Admitting: Adult Health

## 2019-04-21 ENCOUNTER — Encounter: Payer: Self-pay | Admitting: Adult Health

## 2019-04-21 VITALS — BP 120/60 | HR 75 | Temp 98.0°F | Ht 71.0 in | Wt 159.0 lb

## 2019-04-21 DIAGNOSIS — N62 Hypertrophy of breast: Secondary | ICD-10-CM | POA: Diagnosis not present

## 2019-04-21 LAB — TSH: TSH: 1.55 u[IU]/mL (ref 0.35–4.50)

## 2019-04-21 LAB — COMPREHENSIVE METABOLIC PANEL
ALT: 17 U/L (ref 0–53)
AST: 21 U/L (ref 0–37)
Albumin: 3.8 g/dL (ref 3.5–5.2)
Alkaline Phosphatase: 47 U/L (ref 39–117)
BUN: 17 mg/dL (ref 6–23)
CO2: 27 mEq/L (ref 19–32)
Calcium: 8.8 mg/dL (ref 8.4–10.5)
Chloride: 108 mEq/L (ref 96–112)
Creatinine, Ser: 0.98 mg/dL (ref 0.40–1.50)
GFR: 77.84 mL/min (ref >60.00)
Glucose, Bld: 92 mg/dL (ref 70–99)
Potassium: 4.1 mEq/L (ref 3.5–5.1)
Sodium: 141 mEq/L (ref 135–145)
Total Bilirubin: 0.5 mg/dL (ref 0.2–1.2)
Total Protein: 6.1 g/dL (ref 6.0–8.3)

## 2019-04-21 NOTE — Patient Instructions (Signed)
Health Maintenance Due  Topic Date Due  . INFLUENZA VACCINE  10/09/2018    Depression screen [facility] 2/9 03/17/2019 10/06/2016 07/02/2015  Decreased Interest 0 0 0  Down, Depressed, Hopeless 0 0 0  PHQ - 2 Score 0 0 0
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
## 2019-04-22 LAB — TESTOSTERONE TOTAL,FREE,BIO, MALES
Albumin: 3.8 g/dL (ref 3.6–5.1)
Sex Hormone Binding: 41 nmol/L (ref 22–77)
Testosterone, Bioavailable: 135.1 ng/dL (ref >=110.0)
Testosterone, Free: 77.1 pg/mL (ref 46.0–224.0)
Testosterone: 634 ng/dL (ref 250–827)

## 2019-05-12 ENCOUNTER — Other Ambulatory Visit: Payer: Self-pay | Admitting: Internal Medicine

## 2019-05-12 DIAGNOSIS — J449 Chronic obstructive pulmonary disease, unspecified: Secondary | ICD-10-CM

## 2019-05-16 ENCOUNTER — Other Ambulatory Visit: Payer: Self-pay | Admitting: Internal Medicine

## 2019-05-16 DIAGNOSIS — J449 Chronic obstructive pulmonary disease, unspecified: Secondary | ICD-10-CM

## 2019-05-17 ENCOUNTER — Other Ambulatory Visit: Payer: Self-pay | Admitting: Adult Health

## 2019-05-17 DIAGNOSIS — J449 Chronic obstructive pulmonary disease, unspecified: Secondary | ICD-10-CM

## 2019-05-18 NOTE — Telephone Encounter (Signed)
SENT TO THE PHARMACY BY E-SCRIBE. 

## 2019-05-31 ENCOUNTER — Encounter: Payer: Self-pay | Admitting: Adult Health

## 2019-05-31 DIAGNOSIS — I10 Essential (primary) hypertension: Secondary | ICD-10-CM

## 2019-05-31 DIAGNOSIS — I2510 Atherosclerotic heart disease of native coronary artery without angina pectoris: Secondary | ICD-10-CM

## 2019-05-31 MED ORDER — TELMISARTAN 40 MG PO TABS: ORAL_TABLET | ORAL | 0 refills | Status: DC

## 2019-05-31 NOTE — Telephone Encounter (Signed)
Pt scheduled for cpx on 06/28/2019.  No cpx since 2019.  Ok to give 30 day supply?

## 2019-06-12 ENCOUNTER — Other Ambulatory Visit: Payer: Self-pay | Admitting: Adult Health

## 2019-06-12 DIAGNOSIS — N200 Calculus of kidney: Secondary | ICD-10-CM

## 2019-06-14 NOTE — Telephone Encounter (Signed)
Dr. Gloriann Loan also sent in a short supply of this medication for the pt.  Who should fill in the future?

## 2019-06-14 NOTE — Telephone Encounter (Signed)
This was prescribed for an acute issue ( kidney stone) back in January. He should no longer need this medication

## 2019-06-16 ENCOUNTER — Other Ambulatory Visit: Payer: Self-pay | Admitting: Adult Health

## 2019-06-16 DIAGNOSIS — J449 Chronic obstructive pulmonary disease, unspecified: Secondary | ICD-10-CM

## 2019-06-16 NOTE — Telephone Encounter (Signed)
SENT TO THE PHARMACY BY E-SCRIBE.  PT HAS UPCOMING CPX ON 06/28/19

## 2019-06-28 ENCOUNTER — Other Ambulatory Visit: Payer: Self-pay | Admitting: Adult Health

## 2019-06-28 ENCOUNTER — Ambulatory Visit (INDEPENDENT_AMBULATORY_CARE_PROVIDER_SITE_OTHER): Payer: BC Managed Care – PPO | Admitting: Adult Health

## 2019-06-28 ENCOUNTER — Other Ambulatory Visit: Payer: Self-pay

## 2019-06-28 ENCOUNTER — Encounter: Payer: Self-pay | Admitting: Adult Health

## 2019-06-28 VITALS — BP 130/76 | HR 85 | Temp 97.3°F | Ht 71.0 in | Wt 157.8 lb

## 2019-06-28 DIAGNOSIS — I10 Essential (primary) hypertension: Secondary | ICD-10-CM | POA: Diagnosis not present

## 2019-06-28 DIAGNOSIS — Z7289 Other problems related to lifestyle: Secondary | ICD-10-CM | POA: Diagnosis not present

## 2019-06-28 DIAGNOSIS — Z125 Encounter for screening for malignant neoplasm of prostate: Secondary | ICD-10-CM | POA: Diagnosis not present

## 2019-06-28 DIAGNOSIS — E781 Pure hyperglyceridemia: Secondary | ICD-10-CM | POA: Diagnosis not present

## 2019-06-28 DIAGNOSIS — Z0000 Encounter for general adult medical examination without abnormal findings: Secondary | ICD-10-CM

## 2019-06-28 DIAGNOSIS — Z789 Other specified health status: Secondary | ICD-10-CM

## 2019-06-28 DIAGNOSIS — I2510 Atherosclerotic heart disease of native coronary artery without angina pectoris: Secondary | ICD-10-CM

## 2019-06-28 DIAGNOSIS — F1090 Alcohol use, unspecified, uncomplicated: Secondary | ICD-10-CM

## 2019-06-28 LAB — CBC WITH DIFFERENTIAL/PLATELET
Basophils Absolute: 0.1 10*3/uL (ref 0.0–0.1)
Basophils Relative: 0.8 % (ref 0.0–3.0)
Eosinophils Absolute: 0.4 10*3/uL (ref 0.0–0.7)
Eosinophils Relative: 4 % (ref 0.0–5.0)
HCT: 45.8 % (ref 39.0–52.0)
Hemoglobin: 15.7 g/dL (ref 13.0–17.0)
Lymphocytes Relative: 27.6 % (ref 12.0–46.0)
Lymphs Abs: 2.7 10*3/uL (ref 0.7–4.0)
MCHC: 34.3 g/dL (ref 30.0–36.0)
MCV: 97.5 fl (ref 78.0–100.0)
Monocytes Absolute: 1.3 10*3/uL — ABNORMAL HIGH (ref 0.1–1.0)
Monocytes Relative: 13.8 % — ABNORMAL HIGH (ref 3.0–12.0)
Neutro Abs: 5.2 10*3/uL (ref 1.4–7.7)
Neutrophils Relative %: 53.8 % (ref 43.0–77.0)
Platelets: 339 10*3/uL (ref 150.0–400.0)
RBC: 4.7 Mil/uL (ref 4.22–5.81)
RDW: 12.6 % (ref 11.5–15.5)
WBC: 9.6 10*3/uL (ref 4.0–10.5)

## 2019-06-28 LAB — COMPREHENSIVE METABOLIC PANEL
ALT: 17 U/L (ref 0–53)
AST: 25 U/L (ref 0–37)
Albumin: 3.9 g/dL (ref 3.5–5.2)
Alkaline Phosphatase: 63 U/L (ref 39–117)
BUN: 16 mg/dL (ref 6–23)
CO2: 26 mEq/L (ref 19–32)
Calcium: 8.8 mg/dL (ref 8.4–10.5)
Chloride: 102 mEq/L (ref 96–112)
Creatinine, Ser: 1.07 mg/dL (ref 0.40–1.50)
GFR: 70.29 mL/min (ref >60.00)
Glucose, Bld: 96 mg/dL (ref 70–99)
Potassium: 4.5 mEq/L (ref 3.5–5.1)
Sodium: 137 mEq/L (ref 135–145)
Total Bilirubin: 0.7 mg/dL (ref 0.2–1.2)
Total Protein: 6.5 g/dL (ref 6.0–8.3)

## 2019-06-28 LAB — LIPID PANEL
Cholesterol: 200 mg/dL (ref 0–200)
HDL: 62.7 mg/dL (ref >39.00)
LDL Cholesterol: 107 mg/dL — ABNORMAL HIGH (ref 0–99)
NonHDL: 137.25
Total CHOL/HDL Ratio: 3
Triglycerides: 153 mg/dL — ABNORMAL HIGH (ref 0.0–149.0)
VLDL: 30.6 mg/dL (ref 0.0–40.0)

## 2019-06-28 LAB — TSH: TSH: 1.86 u[IU]/mL (ref 0.35–4.50)

## 2019-06-28 LAB — PSA: PSA: 0.72 ng/mL (ref 0.10–4.00)

## 2019-06-28 MED ORDER — ICOSAPENT ETHYL 1 G PO CAPS: 2.0000 g | ORAL_CAPSULE | Freq: Two times a day (BID) | ORAL | 3 refills | Status: DC

## 2019-06-28 MED ORDER — ROSUVASTATIN CALCIUM 40 MG PO TABS: 40.0000 mg | ORAL_TABLET | Freq: Every day | ORAL | 3 refills | Status: DC

## 2019-06-28 MED ORDER — FENOFIBRATE MICRONIZED 134 MG PO CAPS: ORAL_CAPSULE | ORAL | 3 refills | Status: DC

## 2019-06-28 MED ORDER — TELMISARTAN 40 MG PO TABS: ORAL_TABLET | ORAL | 3 refills | Status: DC
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
## 2019-06-28 NOTE — Patient Instructions (Signed)
It was great seeing you today   I will follow up with you regarding your blood work   Please continue to cut back on alcohol   Let me know if you need anything

## 2019-07-10 ENCOUNTER — Other Ambulatory Visit: Payer: Self-pay | Admitting: Adult Health

## 2019-07-10 DIAGNOSIS — J449 Chronic obstructive pulmonary disease, unspecified: Secondary | ICD-10-CM

## 2019-07-12 NOTE — Telephone Encounter (Signed)
Sent to the pharmacy by e-scribe. 

## 2019-07-13 ENCOUNTER — Other Ambulatory Visit: Payer: Self-pay | Admitting: Adult Health

## 2019-07-13 DIAGNOSIS — E785 Hyperlipidemia, unspecified: Secondary | ICD-10-CM

## 2019-07-13 DIAGNOSIS — I2510 Atherosclerotic heart disease of native coronary artery without angina pectoris: Secondary | ICD-10-CM

## 2019-07-13 NOTE — Telephone Encounter (Signed)
MEDICATION HAS BEEN D/C.

## 2019-07-16 ENCOUNTER — Other Ambulatory Visit: Payer: Self-pay | Admitting: Adult Health

## 2019-07-16 DIAGNOSIS — N200 Calculus of kidney: Secondary | ICD-10-CM

## 2019-08-18 ENCOUNTER — Encounter: Payer: Self-pay | Admitting: Adult Health

## 2019-08-24 ENCOUNTER — Other Ambulatory Visit: Payer: Self-pay

## 2019-08-24 ENCOUNTER — Ambulatory Visit (INDEPENDENT_AMBULATORY_CARE_PROVIDER_SITE_OTHER): Payer: BC Managed Care – PPO | Admitting: Adult Health

## 2019-08-24 ENCOUNTER — Encounter: Payer: Self-pay | Admitting: Adult Health

## 2019-08-24 VITALS — BP 140/76 | Temp 98.4°F | Wt 164.0 lb

## 2019-08-24 DIAGNOSIS — M542 Cervicalgia: Secondary | ICD-10-CM | POA: Diagnosis not present
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
## 2019-08-29 ENCOUNTER — Ambulatory Visit (INDEPENDENT_AMBULATORY_CARE_PROVIDER_SITE_OTHER)
Admission: RE | Admit: 2019-08-29 | Discharge: 2019-08-29 | Disposition: A | Discharge status: Home / Self Care | Payer: BC Managed Care – PPO | Source: Ambulatory Visit | Attending: Adult Health | Admitting: Adult Health

## 2019-08-29 ENCOUNTER — Other Ambulatory Visit: Payer: Self-pay

## 2019-08-29 DIAGNOSIS — M542 Cervicalgia: Secondary | ICD-10-CM

## 2019-09-05 ENCOUNTER — Encounter: Payer: Self-pay | Admitting: Adult Health

## 2019-09-06 ENCOUNTER — Other Ambulatory Visit: Payer: Self-pay | Admitting: Family Medicine

## 2019-09-06 DIAGNOSIS — M542 Cervicalgia: Secondary | ICD-10-CM

## 2019-09-28 ENCOUNTER — Telehealth: Payer: Self-pay | Admitting: Adult Health

## 2019-09-28 NOTE — Telephone Encounter (Signed)
Pt called in stating that he no longer needs the MRI order from our office b/c his surgeon could get him in on 09/29/2019 in the a.m.  Order was cancelled by Lavella Lemons so that the surgeons order can be fulfilled.

## 2019-10-03 ENCOUNTER — Other Ambulatory Visit: Payer: Self-pay | Admitting: *Deleted

## 2019-10-03 DIAGNOSIS — M542 Cervicalgia: Secondary | ICD-10-CM

## 2020-02-21 ENCOUNTER — Ambulatory Visit: Payer: BC Managed Care – PPO | Admitting: Medical

## 2020-03-19 ENCOUNTER — Encounter: Payer: Self-pay | Admitting: Adult Health

## 2020-06-07 ENCOUNTER — Encounter: Payer: Self-pay | Admitting: Cardiovascular Disease

## 2020-06-07 ENCOUNTER — Ambulatory Visit: Payer: BC Managed Care – PPO | Admitting: Cardiovascular Disease

## 2020-06-07 ENCOUNTER — Other Ambulatory Visit: Payer: Self-pay

## 2020-06-07 VITALS — BP 138/70 | HR 63 | Ht 71.0 in | Wt 176.6 lb

## 2020-06-07 DIAGNOSIS — Z7289 Other problems related to lifestyle: Secondary | ICD-10-CM

## 2020-06-07 DIAGNOSIS — I2510 Atherosclerotic heart disease of native coronary artery without angina pectoris: Secondary | ICD-10-CM

## 2020-06-07 DIAGNOSIS — I10 Essential (primary) hypertension: Secondary | ICD-10-CM

## 2020-06-07 DIAGNOSIS — E782 Mixed hyperlipidemia: Secondary | ICD-10-CM | POA: Diagnosis not present

## 2020-06-07 DIAGNOSIS — Z789 Other specified health status: Secondary | ICD-10-CM

## 2020-06-07 DIAGNOSIS — F1090 Alcohol use, unspecified, uncomplicated: Secondary | ICD-10-CM

## 2020-06-07 LAB — COMPREHENSIVE METABOLIC PANEL
ALT: 44 IU/L (ref 0–44)
AST: 41 IU/L — ABNORMAL HIGH (ref 0–40)
Albumin/Globulin Ratio: 1.6 (ref 1.2–2.2)
Albumin: 4.1 g/dL (ref 3.8–4.8)
Alkaline Phosphatase: 78 IU/L (ref 44–121)
BUN/Creatinine Ratio: 17 (ref 10–24)
BUN: 16 mg/dL (ref 8–27)
Bilirubin Total: 0.4 mg/dL (ref 0.0–1.2)
CO2: 21 mmol/L (ref 20–29)
Calcium: 9.2 mg/dL (ref 8.6–10.2)
Chloride: 103 mmol/L (ref 96–106)
Creatinine, Ser: 0.93 mg/dL (ref 0.76–1.27)
Globulin, Total: 2.5 g/dL (ref 1.5–4.5)
Glucose: 91 mg/dL (ref 65–99)
Potassium: 4.7 mmol/L (ref 3.5–5.2)
Sodium: 140 mmol/L (ref 134–144)
Total Protein: 6.6 g/dL (ref 6.0–8.5)
eGFR: 93 mL/min/{1.73_m2} (ref >59)

## 2020-06-07 LAB — LIPID PANEL
Chol/HDL Ratio: 2.6 ratio (ref 0.0–5.0)
Cholesterol, Total: 164 mg/dL (ref 100–199)
HDL: 63 mg/dL (ref >39)
LDL Chol Calc (NIH): 83 mg/dL (ref 0–99)
Triglycerides: 101 mg/dL (ref 0–149)
VLDL Cholesterol Cal: 18 mg/dL (ref 5–40)

## 2020-06-07 NOTE — Patient Instructions (Signed)
Medication Instructions:  No changes *If you need a refill on your cardiac medications before your next appointment, please call your pharmacy*   Lab Work: Your provider would like for you to have the following labs today: fasting lipid and CMET  If you have labs (blood work) drawn today and your tests are completely normal, you will receive your results only by: Marland Kitchen MyChart Message (if you have MyChart) OR . A paper copy in the mail If you have any lab test that is abnormal or we need to change your treatment, we will call you to review the results.   Testing/Procedures: None ordered   Follow-Up: At [facility], you and your health needs are our priority.  As part of our continuing mission to provide you with exceptional heart care, we have created designated Provider Care Teams.  These Care Teams include your primary Cardiologist (physician) and Advanced Practice Providers (APPs -  Physician Assistants and Nurse Practitioners) who all work together to provide you with the care you need, when you need it.  We recommend signing up for the patient portal called "MyChart".  Sign up information is provided on this After Visit Summary.  MyChart is used to connect with patients for Virtual Visits (Telemedicine).  Patients are able to view lab/test results, encounter notes, upcoming appointments, etc.  Non-urgent messages can be sent to your provider as well.   To learn more about what you can do with MyChart, go to [email or web address].    Your next appointment:   12 month(s)  The format for your next appointment:   In Person  Provider:   You may see Sanda Klein, MD or one of the following Advanced Practice Providers on your designated Care Team:    Almyra Deforest, PA-C  Fabian Sharp, PA-C or   Roby Lofts, [place]
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
## 2020-06-11 ENCOUNTER — Other Ambulatory Visit: Payer: Self-pay | Admitting: *Deleted

## 2020-06-11 DIAGNOSIS — I2510 Atherosclerotic heart disease of native coronary artery without angina pectoris: Secondary | ICD-10-CM

## 2020-06-11 DIAGNOSIS — I10 Essential (primary) hypertension: Secondary | ICD-10-CM

## 2020-06-11 MED ORDER — FENOFIBRATE MICRONIZED 134 MG PO CAPS: ORAL_CAPSULE | ORAL | 3 refills | Status: DC

## 2020-06-11 MED ORDER — ROSUVASTATIN CALCIUM 40 MG PO TABS: 40.0000 mg | ORAL_TABLET | Freq: Every day | ORAL | 3 refills | Status: DC

## 2020-06-11 MED ORDER — TELMISARTAN 40 MG PO TABS: ORAL_TABLET | ORAL | 3 refills | Status: DC

## 2020-06-11 MED ORDER — ICOSAPENT ETHYL 1 G PO CAPS: 2.0000 g | ORAL_CAPSULE | Freq: Two times a day (BID) | ORAL | 3 refills | Status: DC

## 2020-06-12 MED ORDER — FENOFIBRATE MICRONIZED 134 MG PO CAPS: ORAL_CAPSULE | ORAL | 3 refills | Status: DC

## 2020-07-10 ENCOUNTER — Other Ambulatory Visit: Payer: Self-pay | Admitting: Adult Health

## 2020-07-10 DIAGNOSIS — J449 Chronic obstructive pulmonary disease, unspecified: Secondary | ICD-10-CM

## 2020-09-04 ENCOUNTER — Other Ambulatory Visit: Payer: Self-pay | Admitting: Adult Health

## 2020-09-04 DIAGNOSIS — J449 Chronic obstructive pulmonary disease, unspecified: Secondary | ICD-10-CM

## 2020-09-07 ENCOUNTER — Telehealth: Payer: Self-pay | Admitting: Adult Health

## 2020-09-07 ENCOUNTER — Other Ambulatory Visit: Payer: Self-pay

## 2020-09-07 DIAGNOSIS — J449 Chronic obstructive pulmonary disease, unspecified: Secondary | ICD-10-CM

## 2020-09-07 MED ORDER — BUDESONIDE-FORMOTEROL FUMARATE 80-4.5 MCG/ACT IN AERO: 2.0000 | INHALATION_SPRAY | Freq: Two times a day (BID) | RESPIRATORY_TRACT | 0 refills | Status: DC

## 2020-09-07 NOTE — Telephone Encounter (Signed)
Pt is calling in to see if he can get Rx  SYMBICORT 80-4.5 due to him being out of it and really need it before the weekend.  Pharm:  [facility].  Pt would like to have a call back once it has been called in.    NEW PHARMACY:  [facility] [address]

## 2020-09-07 NOTE — Telephone Encounter (Signed)
Pt call and stated his medication was sent to the wrong pharmacy and want sent to to [facility] [place].

## 2020-09-07 NOTE — Telephone Encounter (Signed)
Done! Pt notified of update!

## 2020-09-07 NOTE — Telephone Encounter (Signed)
This has been taking care of.

## 2020-09-13 NOTE — Telephone Encounter (Signed)
error 

## 2020-09-27 ENCOUNTER — Other Ambulatory Visit: Payer: Self-pay

## 2020-09-28 ENCOUNTER — Encounter: Payer: Self-pay | Admitting: Adult Health

## 2020-09-28 ENCOUNTER — Ambulatory Visit (INDEPENDENT_AMBULATORY_CARE_PROVIDER_SITE_OTHER): Payer: BC Managed Care – PPO | Admitting: Adult Health

## 2020-09-28 VITALS — BP 122/80 | HR 79 | Temp 98.9°F | Ht 71.0 in | Wt 166.0 lb

## 2020-09-28 DIAGNOSIS — E781 Pure hyperglyceridemia: Secondary | ICD-10-CM | POA: Diagnosis not present

## 2020-09-28 DIAGNOSIS — Z125 Encounter for screening for malignant neoplasm of prostate: Secondary | ICD-10-CM | POA: Diagnosis not present

## 2020-09-28 DIAGNOSIS — I10 Essential (primary) hypertension: Secondary | ICD-10-CM

## 2020-09-28 DIAGNOSIS — Z0000 Encounter for general adult medical examination without abnormal findings: Secondary | ICD-10-CM

## 2020-09-28 DIAGNOSIS — Z8551 Personal history of malignant neoplasm of bladder: Secondary | ICD-10-CM

## 2020-09-28 DIAGNOSIS — J449 Chronic obstructive pulmonary disease, unspecified: Secondary | ICD-10-CM

## 2020-09-28 LAB — COMPREHENSIVE METABOLIC PANEL
ALT: 44 U/L (ref 0–53)
AST: 43 U/L — ABNORMAL HIGH (ref 0–37)
Albumin: 3.6 g/dL (ref 3.5–5.2)
Alkaline Phosphatase: 76 U/L (ref 39–117)
BUN: 22 mg/dL (ref 6–23)
CO2: 26 mEq/L (ref 19–32)
Calcium: 8.6 mg/dL (ref 8.4–10.5)
Chloride: 104 mEq/L (ref 96–112)
Creatinine, Ser: 0.97 mg/dL (ref 0.40–1.50)
GFR: 83.9 mL/min (ref >60.00)
Glucose, Bld: 86 mg/dL (ref 70–99)
Potassium: 4.6 mEq/L (ref 3.5–5.1)
Sodium: 139 mEq/L (ref 135–145)
Total Bilirubin: 0.6 mg/dL (ref 0.2–1.2)
Total Protein: 6 g/dL (ref 6.0–8.3)

## 2020-09-28 LAB — CBC WITH DIFFERENTIAL/PLATELET
Basophils Absolute: 0.1 10*3/uL (ref 0.0–0.1)
Basophils Relative: 0.8 % (ref 0.0–3.0)
Eosinophils Absolute: 0.3 10*3/uL (ref 0.0–0.7)
Eosinophils Relative: 3.5 % (ref 0.0–5.0)
HCT: 41.8 % (ref 39.0–52.0)
Hemoglobin: 14.5 g/dL (ref 13.0–17.0)
Lymphocytes Relative: 27.5 % (ref 12.0–46.0)
Lymphs Abs: 2.3 10*3/uL (ref 0.7–4.0)
MCHC: 34.6 g/dL (ref 30.0–36.0)
MCV: 95.2 fl (ref 78.0–100.0)
Monocytes Absolute: 0.9 10*3/uL (ref 0.1–1.0)
Monocytes Relative: 10.8 % (ref 3.0–12.0)
Neutro Abs: 4.8 10*3/uL (ref 1.4–7.7)
Neutrophils Relative %: 57.4 % (ref 43.0–77.0)
Platelets: 242 10*3/uL (ref 150.0–400.0)
RBC: 4.4 Mil/uL (ref 4.22–5.81)
RDW: 12.4 % (ref 11.5–15.5)
WBC: 8.3 10*3/uL (ref 4.0–10.5)

## 2020-09-28 LAB — HEMOGLOBIN A1C: Hgb A1c MFr Bld: 5.8 % (ref 4.6–6.5)

## 2020-09-28 LAB — PSA: PSA: 0.44 ng/mL (ref 0.10–4.00)

## 2020-09-28 LAB — TSH: TSH: 1.1 u[IU]/mL (ref 0.35–5.50)

## 2020-09-28 MED ORDER — ALBUTEROL SULFATE HFA 108 (90 BASE) MCG/ACT IN AERS: 1.0000 | INHALATION_SPRAY | Freq: Four times a day (QID) | RESPIRATORY_TRACT | 2 refills | Status: DC | PRN

## 2020-09-28 NOTE — Patient Instructions (Addendum)
It was great seeing you today   We will follow up with you regarding your blood work   I will see you back in one year or sooner if needed 

## 2020-09-28 NOTE — Addendum Note (Signed)
Addended by: Amanda Cockayne on: 09/28/2020 09:36 AM   Modules accepted: Orders

## 2020-09-28 NOTE — Progress Notes (Signed)
Subjective:    Patient ID: Stephen Davila, male    DOB: 04-27-1960, 60 y.o.   MRN: [id number]  HPI Patient presents for yearly preventative medicine examination. He is a pleasant 60 year old male who  has a past medical history of Complication of anesthesia, COPD (chronic obstructive pulmonary disease) ([place]), Hyperlipidemia, and Hypertension.  Hypertension-prescribed Micardis 40 mg daily.  Denies episodes of dizziness, lightheadedness, chest pain, shortness of breath, blurred vision, or palpitations BP Readings from Last 3 Encounters:  09/28/20 122/80  06/07/20 138/70  08/24/19 140/76   Hyperlipidemia -prescribed pravastatin 40 mg, Vascepa 2 g twice daily, and fenofibrate 134 mg daily.  He denies myalgia or fatigue. Managed by Cardiology  Lab Results  Component Value Date   CHOL 164 06/07/2020   HDL 63 06/07/2020   LDLCALC 83 06/07/2020   LDLDIRECT 87.0 11/25/2017   TRIG 101 06/07/2020   CHOLHDL 2.6 06/07/2020   H/o of bladder tumor -is a dental finding of malignant bladder tumor back in December 2021.  Had resection done.  Is followed by urology and everything has been cleared on follow up.   COPD - mild, quit smoking in 2011. Uses Proair and Symbicrot  All immunizations and health maintenance protocols were reviewed with the patient and needed orders were placed.  Appropriate screening laboratory values were ordered for the patient including screening of hyperlipidemia, renal function and hepatic function. If indicated by BPH, a PSA was ordered.  Medication reconciliation,  past medical history, social history, problem list and allergies were reviewed in detail with the patient  Goals were established with regard to weight loss, exercise, and  diet in compliance with medications. He goes for walks of several miles  Wt Readings from Last 3 Encounters:  09/28/20 166 lb (75.3 kg)  06/07/20 176 lb 9.6 oz (80.1 kg)  08/24/19 164 lb (74.4 kg)   He is up to date on  routine colon cancer screening   Review of Systems  Constitutional: Negative.   HENT: Negative.    Eyes: Negative.   Respiratory: Negative.    Cardiovascular: Negative.   Gastrointestinal: Negative.   Endocrine: Negative.   Genitourinary: Negative.   Musculoskeletal: Negative.   Skin: Negative.   Allergic/Immunologic: Negative.   Neurological: Negative.   Hematological: Negative.   Psychiatric/Behavioral: Negative.    All other systems reviewed and are negative. Past Medical History:  Diagnosis Date   Complication of anesthesia    bad hangover   COPD (chronic obstructive pulmonary disease) ([place])    Hyperlipidemia    Hypertension     Social History   Socioeconomic History   Marital status: Married    Spouse name: Not on file   Number of children: Not on file   Years of education: Not on file   Highest education level: Not on file  Occupational History   Not on file  Tobacco Use   Smoking status: Former    Packs/day: 2.00    Years: 35.00    Pack years: 70.00    Types: Cigarettes    Quit date: 06/15/2009    Years since quitting: 11.2   Smokeless tobacco: Never  Substance and Sexual Activity   Alcohol use: Yes    Alcohol/week: 42.0 standard drinks    Types: 42 Cans of beer per week    Comment: socially    Drug use: No   Sexual activity: Yes  Other Topics Concern   Not on file  Social History Narrative   [name] for  46 years    Married    No kids    Social Determinants of Health   Financial Resource Strain: Not on file  Food Insecurity: Not on file  Transportation Needs: Not on file  Physical Activity: Not on file  Stress: Not on file  Social Connections: Not on file  Intimate Partner Violence: Not on file    Past Surgical History:  Procedure Laterality Date   [place]. has screw in it   CERVICAL SPINE SURGERY  10-01-12   5,6,7   CYSTOSCOPY/URETEROSCOPY/HOLMIUM LASER/STENT PLACEMENT N/A 03/21/2019   Procedure: CYSTOSCOPYl/LEFT  URETEROSCOPY/HOLMIUM LASER/RETROGRADE/BILATERAL STENT PLACEMENT/BLADDER RESECTION/RIGHT RETROGRADE;  Surgeon: Lucas Mallow, MD;  Location: [facility];  Service: Urology;  Laterality: N/A;    Family History  Problem Relation Age of Onset   Prostate cancer Father    Heart disease Father    Melanoma Sister    Arthritis Mother    Diabetes Neg Hx    Stroke Neg Hx    Kidney disease Neg Hx    Hypertension Neg Hx    Hyperlipidemia Neg Hx    Colon cancer Neg Hx    Pancreatic cancer Neg Hx    Stomach cancer Neg Hx     No Known Allergies  Current Outpatient Medications on File Prior to Visit  Medication Sig Dispense Refill   budesonide-formoterol (SYMBICORT) 80-4.5 MCG/ACT inhaler Inhale 2 puffs into the lungs 2 (two) times daily. 30.6 each 0   fenofibrate micronized (LOFIBRA) 134 MG capsule TAKE 1 CAPSULE (134 MG TOTAL) BY MOUTH DAILY BEFORE BREAKFAST. 90 capsule 3   icosapent Ethyl (VASCEPA) 1 g capsule Take 2 capsules (2 g total) by mouth 2 (two) times daily. 360 capsule 3   PROAIR HFA 108 (90 Base) MCG/ACT inhaler TAKE 2 PUFFS BY MOUTH EVERY 6 HOURS AS NEEDED FOR WHEEZE (Patient taking differently: Inhale 2 puffs into the lungs every 6 (six) hours as needed for wheezing.) 8.5 Inhaler 2   rosuvastatin (CRESTOR) 40 MG tablet Take 1 tablet (40 mg total) by mouth daily. 90 tablet 3   telmisartan (MICARDIS) 40 MG tablet TAKE 1 TABLET BY MOUTH EVERY DAY 90 tablet 3   No current facility-administered medications on file prior to visit.    BP 122/80   Pulse 79   Temp 98.9 F (37.2 C) (Oral)   Ht '5\' 11"'$  (1.803 m)   Wt 166 lb (75.3 kg)   SpO2 95%   BMI 23.15 kg/m       Objective:   Physical Exam Vitals and nursing note reviewed.  Constitutional:      General: He is not in acute distress.    Appearance: Normal appearance. He is well-developed and normal weight.  HENT:     Head: Normocephalic and atraumatic.     Right Ear: Tympanic membrane, ear canal and external ear normal.  There is no impacted cerumen.     Left Ear: Tympanic membrane, ear canal and external ear normal. There is no impacted cerumen.     Nose: Nose normal. No congestion or rhinorrhea.     Mouth/Throat:     Mouth: Mucous membranes are moist.     Pharynx: Oropharynx is clear. No oropharyngeal exudate or posterior oropharyngeal erythema.  Eyes:     General:        Right eye: No discharge.        Left eye: No discharge.     Extraocular Movements: Extraocular movements intact.  Conjunctiva/sclera: Conjunctivae normal.     Pupils: Pupils are equal, round, and reactive to light.  Neck:     Vascular: No carotid bruit.     Trachea: No tracheal deviation.  Cardiovascular:     Rate and Rhythm: Normal rate and regular rhythm.     Pulses: Normal pulses.     Heart sounds: Normal heart sounds. No murmur heard.   No friction rub. No gallop.  Pulmonary:     Effort: Pulmonary effort is normal. No respiratory distress.     Breath sounds: Normal breath sounds. No stridor. No wheezing, rhonchi or rales.  Chest:     Chest wall: No tenderness.  Abdominal:     General: Bowel sounds are normal. There is no distension.     Palpations: Abdomen is soft. There is no mass.     Tenderness: There is no abdominal tenderness. There is no right CVA tenderness, left CVA tenderness, guarding or rebound.     Hernia: No hernia is present.  Musculoskeletal:        General: No swelling, tenderness, deformity or signs of injury. Normal range of motion.     Right lower leg: No edema.     Left lower leg: No edema.  Lymphadenopathy:     Cervical: No cervical adenopathy.  Skin:    General: Skin is warm and dry.     Capillary Refill: Capillary refill takes less than 2 seconds.     Coloration: Skin is not jaundiced or pale.     Findings: No bruising, erythema, lesion or rash.  Neurological:     General: No focal deficit present.     Mental Status: He is alert and oriented to person, place, and time.     Cranial Nerves:  No cranial nerve deficit.     Sensory: No sensory deficit.     Motor: No weakness.     Coordination: Coordination normal.     Gait: Gait normal.     Deep Tendon Reflexes: Reflexes normal.  Psychiatric:        Mood and Affect: Mood normal.        Behavior: Behavior normal.        Thought Content: Thought content normal.        Judgment: Judgment normal.      Assessment & Plan:  1. Routine general medical examination at a health care facility - Encouraged to continue with routine exercise and heart healthy diet - Follow up in one year or sooner if needed - CBC with Differential/Platelet; Future - Comprehensive metabolic panel; Future - Hemoglobin A1c; Future - TSH; Future  2. Essential hypertension - Controlled. No change  - CBC with Differential/Platelet; Future - Comprehensive metabolic panel; Future - Hemoglobin A1c; Future - TSH; Future  3. Hypertriglyceridemia - Continue with plan of care by Cardiology  - CBC with Differential/Platelet; Future - Comprehensive metabolic panel; Future - Hemoglobin A1c; Future - TSH; Future  4. Prostate cancer screening  - PSA; Future  5. History of bladder cancer - Follow up with Urology as directed  6. COPD GOLD I  - albuterol (PROAIR HFA) 108 (90 Base) MCG/ACT inhaler; Inhale 1-2 puffs into the lungs every 6 (six) hours as needed for wheezing or shortness of breath. TAKE 2 PUFFS BY MOUTH EVERY 6 HOURS AS NEEDED FOR WHEEZE  Dispense: 8.5 each; Refill: 2  Dorothyann Peng, NP

## 2020-10-29 ENCOUNTER — Emergency Department (HOSPITAL_BASED_OUTPATIENT_CLINIC_OR_DEPARTMENT_OTHER): Payer: BC Managed Care – PPO

## 2020-10-29 ENCOUNTER — Emergency Department (HOSPITAL_BASED_OUTPATIENT_CLINIC_OR_DEPARTMENT_OTHER)
Admission: EM | Admit: 2020-10-29 | Discharge: 2020-10-30 | Disposition: A | Discharge status: Home / Self Care | Payer: BC Managed Care – PPO | Attending: Emergency Medicine | Admitting: Emergency Medicine

## 2020-10-29 ENCOUNTER — Other Ambulatory Visit: Payer: Self-pay

## 2020-10-29 ENCOUNTER — Encounter (HOSPITAL_BASED_OUTPATIENT_CLINIC_OR_DEPARTMENT_OTHER): Payer: Self-pay

## 2020-10-29 DIAGNOSIS — J449 Chronic obstructive pulmonary disease, unspecified: Secondary | ICD-10-CM | POA: Insufficient documentation

## 2020-10-29 DIAGNOSIS — I2510 Atherosclerotic heart disease of native coronary artery without angina pectoris: Secondary | ICD-10-CM | POA: Diagnosis not present

## 2020-10-29 DIAGNOSIS — R109 Unspecified abdominal pain: Secondary | ICD-10-CM | POA: Diagnosis present

## 2020-10-29 DIAGNOSIS — Z87891 Personal history of nicotine dependence: Secondary | ICD-10-CM | POA: Insufficient documentation

## 2020-10-29 DIAGNOSIS — R1013 Epigastric pain: Secondary | ICD-10-CM

## 2020-10-29 DIAGNOSIS — I10 Essential (primary) hypertension: Secondary | ICD-10-CM | POA: Diagnosis not present

## 2020-10-29 DIAGNOSIS — Z79899 Other long term (current) drug therapy: Secondary | ICD-10-CM | POA: Insufficient documentation

## 2020-10-29 LAB — CBC WITH DIFFERENTIAL/PLATELET
Abs Immature Granulocytes: 0.1 10*3/uL — ABNORMAL HIGH (ref 0.00–0.07)
Basophils Absolute: 0.1 10*3/uL (ref 0.0–0.1)
Basophils Relative: 1 %
Eosinophils Absolute: 0.2 10*3/uL (ref 0.0–0.5)
Eosinophils Relative: 2 %
HCT: 44.3 % (ref 39.0–52.0)
Hemoglobin: 15.5 g/dL (ref 13.0–17.0)
Immature Granulocytes: 1 %
Lymphocytes Relative: 22 %
Lymphs Abs: 2.7 10*3/uL (ref 0.7–4.0)
MCH: 32.9 pg (ref 26.0–34.0)
MCHC: 35 g/dL (ref 30.0–36.0)
MCV: 94.1 fL (ref 80.0–100.0)
Monocytes Absolute: 1.2 10*3/uL — ABNORMAL HIGH (ref 0.1–1.0)
Monocytes Relative: 10 %
Neutro Abs: 7.9 10*3/uL — ABNORMAL HIGH (ref 1.7–7.7)
Neutrophils Relative %: 64 %
Platelets: 267 10*3/uL (ref 150–400)
RBC: 4.71 MIL/uL (ref 4.22–5.81)
RDW: 12.1 % (ref 11.5–15.5)
WBC: 12.2 10*3/uL — ABNORMAL HIGH (ref 4.0–10.5)
nRBC: 0 % (ref 0.0–0.2)

## 2020-10-29 LAB — COMPREHENSIVE METABOLIC PANEL
ALT: 41 U/L (ref 0–44)
AST: 43 U/L — ABNORMAL HIGH (ref 15–41)
Albumin: 3.8 g/dL (ref 3.5–5.0)
Alkaline Phosphatase: 73 U/L (ref 38–126)
Anion gap: 10 (ref 5–15)
BUN: 23 mg/dL (ref 8–23)
CO2: 24 mmol/L (ref 22–32)
Calcium: 8.5 mg/dL — ABNORMAL LOW (ref 8.9–10.3)
Chloride: 100 mmol/L (ref 98–111)
Creatinine, Ser: 1.19 mg/dL (ref 0.61–1.24)
GFR, Estimated: >60 mL/min (ref >60)
Glucose, Bld: 100 mg/dL — ABNORMAL HIGH (ref 70–99)
Potassium: 4.1 mmol/L (ref 3.5–5.1)
Sodium: 134 mmol/L — ABNORMAL LOW (ref 135–145)
Total Bilirubin: 0.6 mg/dL (ref 0.3–1.2)
Total Protein: 6.8 g/dL (ref 6.5–8.1)

## 2020-10-29 LAB — URINALYSIS, ROUTINE W REFLEX MICROSCOPIC
Bilirubin Urine: NEGATIVE
Glucose, UA: NEGATIVE mg/dL
Hgb urine dipstick: NEGATIVE
Ketones, ur: NEGATIVE mg/dL
Leukocytes,Ua: NEGATIVE
Nitrite: NEGATIVE
Protein, ur: NEGATIVE mg/dL
Specific Gravity, Urine: 1.008 (ref 1.005–1.030)
pH: 5.5 (ref 5.0–8.0)

## 2020-10-29 LAB — TROPONIN I (HIGH SENSITIVITY)
Troponin I (High Sensitivity): 6 ng/L (ref <18)
Troponin I (High Sensitivity): 7 ng/L (ref <18)

## 2020-10-29 LAB — LIPASE, BLOOD: Lipase: 24 U/L (ref 11–51)

## 2020-10-29 MED ORDER — FAMOTIDINE 20 MG PO TABS
20.0000 mg | ORAL_TABLET | Freq: Once | ORAL | Status: AC
  Administered 2020-10-29: 20 mg via ORAL
  Filled 2020-10-29: qty 1

## 2020-10-29 MED ORDER — LIDOCAINE VISCOUS HCL 2 % MT SOLN
15.0000 mL | Freq: Once | OROMUCOSAL | Status: AC
  Administered 2020-10-29: 15 mL via ORAL
  Filled 2020-10-29: qty 15

## 2020-10-29 MED ORDER — ALUM & MAG HYDROXIDE-SIMETH 200-200-20 MG/5ML PO SUSP
30.0000 mL | Freq: Once | ORAL | Status: AC
  Administered 2020-10-29: 30 mL via ORAL
  Filled 2020-10-29: qty 30

## 2020-10-29 MED ORDER — IOHEXOL 350 MG/ML SOLN
100.0000 mL | Freq: Once | INTRAVENOUS | Status: AC | PRN
  Administered 2020-10-29: 75 mL via INTRAVENOUS
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
## 2020-10-29 NOTE — ED Triage Notes (Signed)
Pt arrives POV with complaints of upper abdominal pain since about 2:30 pm today.  Denies fever, nausea, vomiting, diarrhea and shortness of breath.

## 2021-01-02 ENCOUNTER — Telehealth (INDEPENDENT_AMBULATORY_CARE_PROVIDER_SITE_OTHER): Payer: BC Managed Care – PPO | Admitting: Adult Health

## 2021-01-02 ENCOUNTER — Encounter: Payer: Self-pay | Admitting: Adult Health

## 2021-01-02 VITALS — HR 74 | Ht 71.0 in | Wt 165.0 lb

## 2021-01-02 DIAGNOSIS — J069 Acute upper respiratory infection, unspecified: Secondary | ICD-10-CM

## 2021-01-02 MED ORDER — AZELASTINE HCL 0.1 % NA SOLN: 2.0000 | Freq: Two times a day (BID) | NASAL | 1 refills | Status: DC
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
## 2021-01-20 IMAGING — CT CT RENAL STONE PROTOCOL
2 of 4 series · 15 of 46 positions shown, 17 images · non-contrast
Comparison: Remote CT 04/19/2003

CLINICAL DATA: Left flank pain.

EXAM:
CT ABDOMEN AND PELVIS WITHOUT CONTRAST
TECHNIQUE: Multidetector CT imaging of the abdomen and pelvis was performed
following the standard protocol without IV contrast.

[Series 2: axial st · axial · 0.74mm/px · z∈[-564,-184]mm · 12 of 84 slices shown, 14 images]
[im 4/84  soft-tissue]
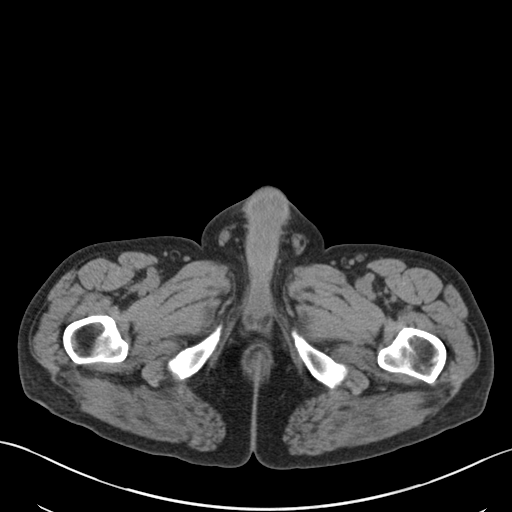
[im 4/84  bone]
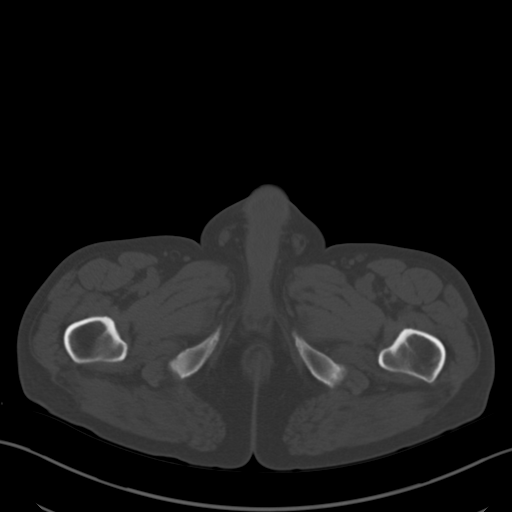
[im 12/84  soft-tissue]
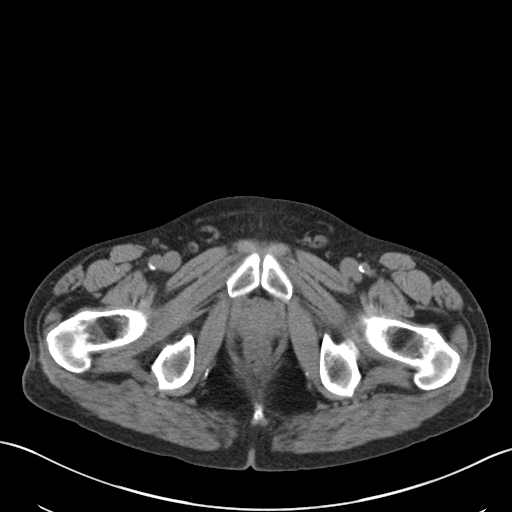
[im 20/84  soft-tissue]
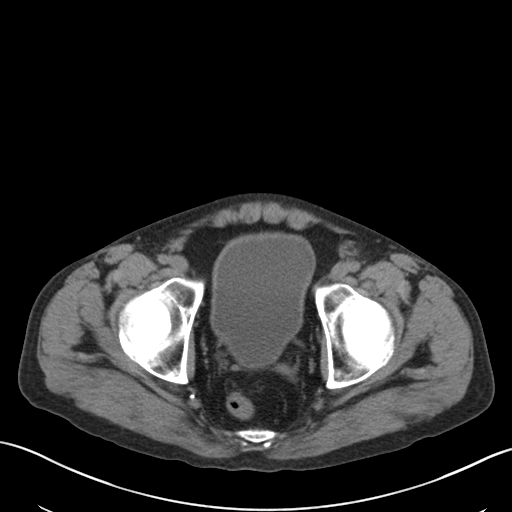
[im 24/84  soft-tissue]
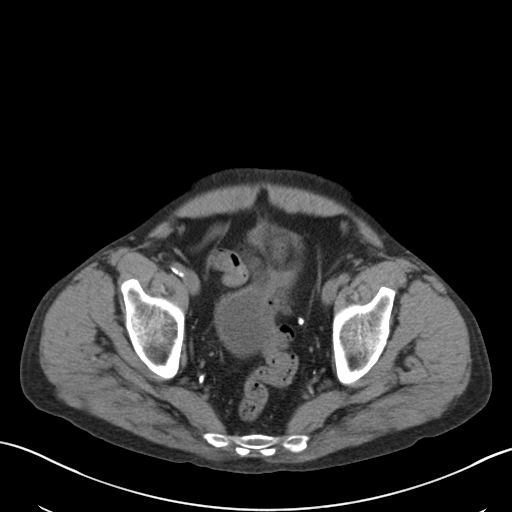
[im 32/84  soft-tissue]
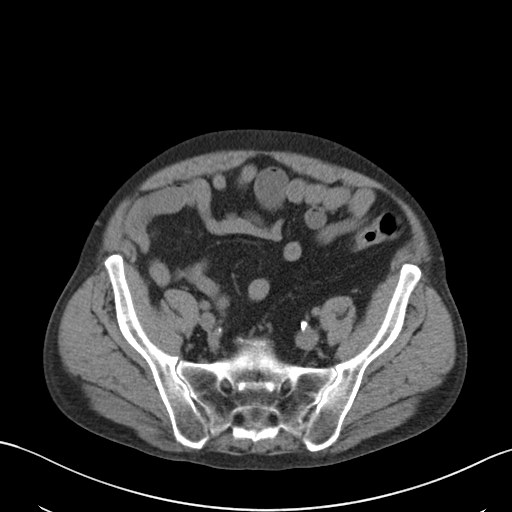
[im 40/84  soft-tissue]
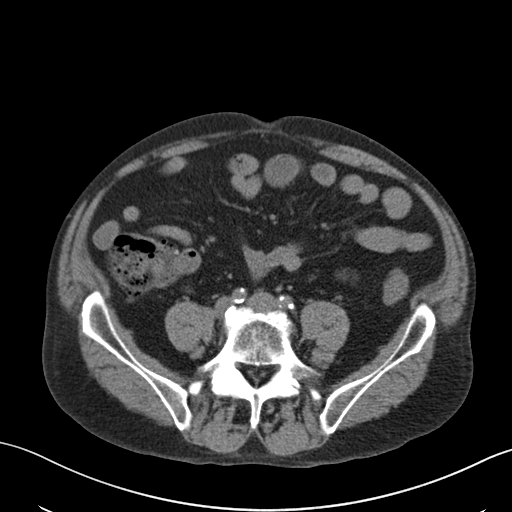
[im 44/84  soft-tissue]
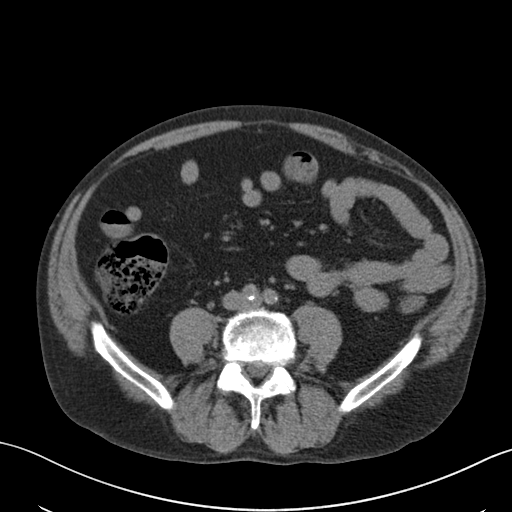
[im 52/84  soft-tissue]
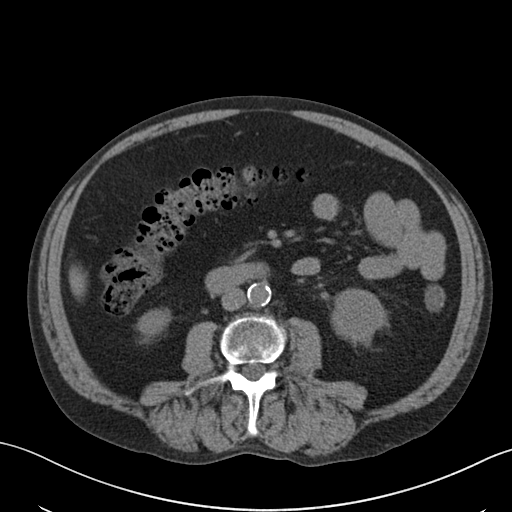
[im 60/84  soft-tissue]
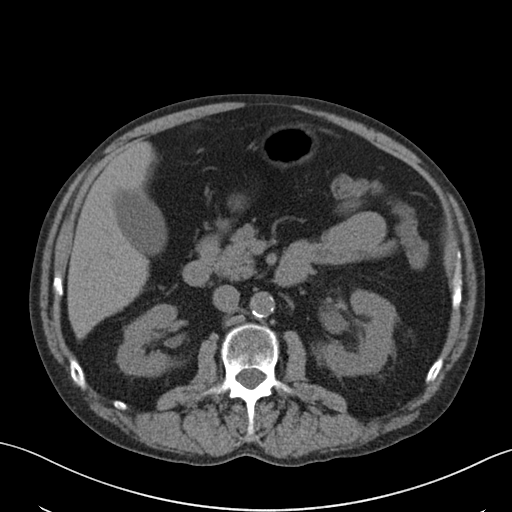
[im 60/84  bone]
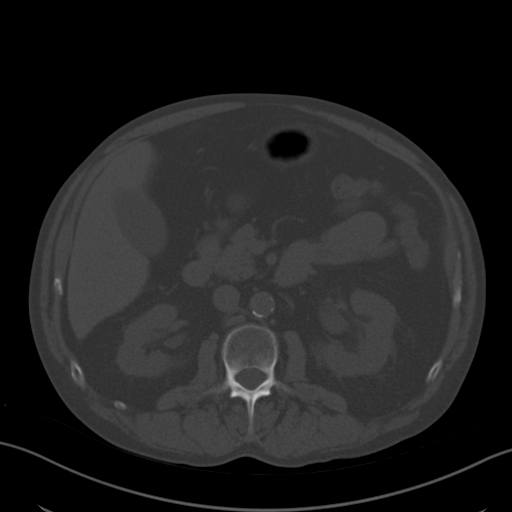
[im 64/84  soft-tissue]
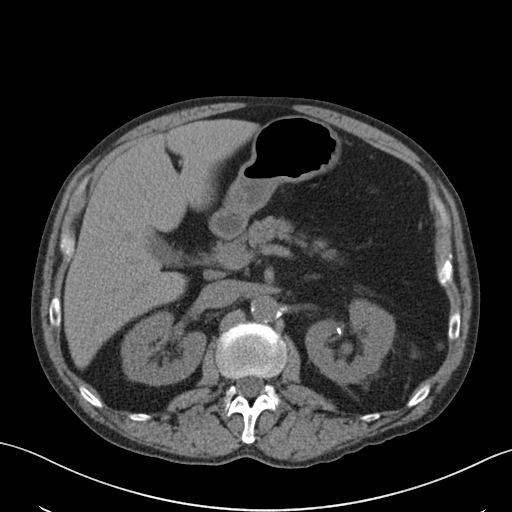
[im 72/84  soft-tissue]
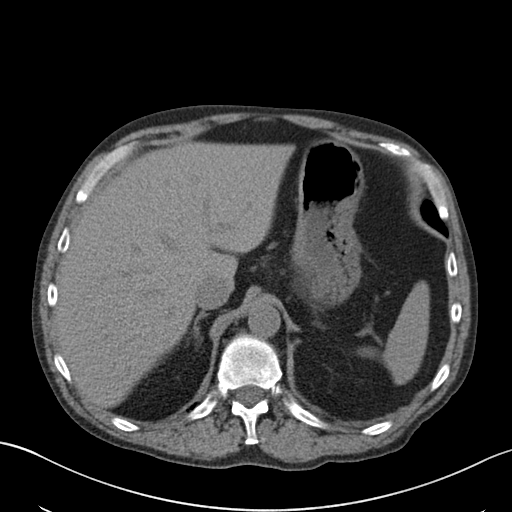
[im 80/84  soft-tissue]
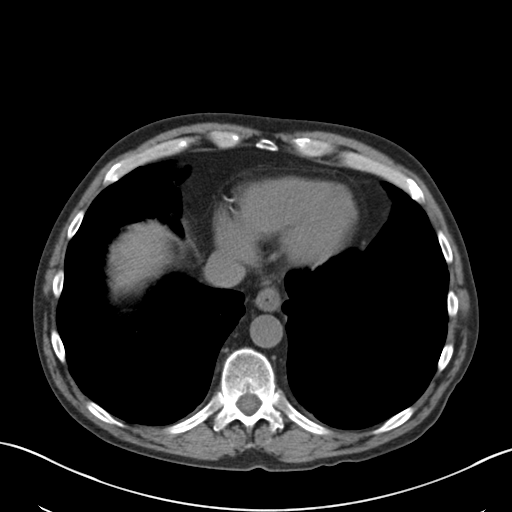

[Series 4: coronal · coronal · 0.66mm/px · 3 of 133 slices shown]
[im 45/133  soft-tissue]
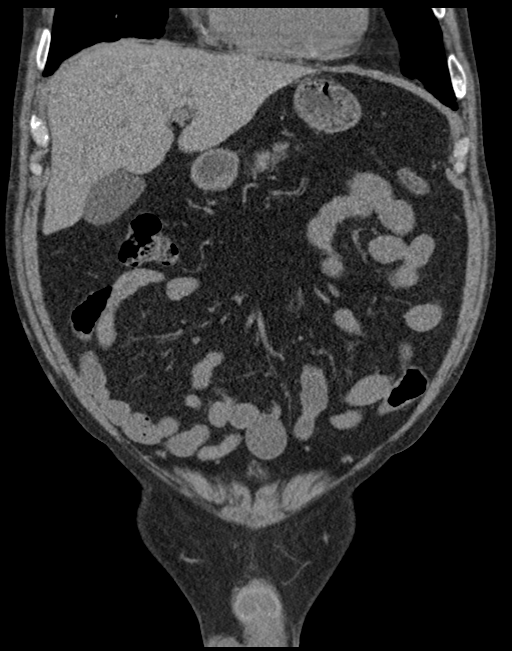
[im 59/133  soft-tissue]
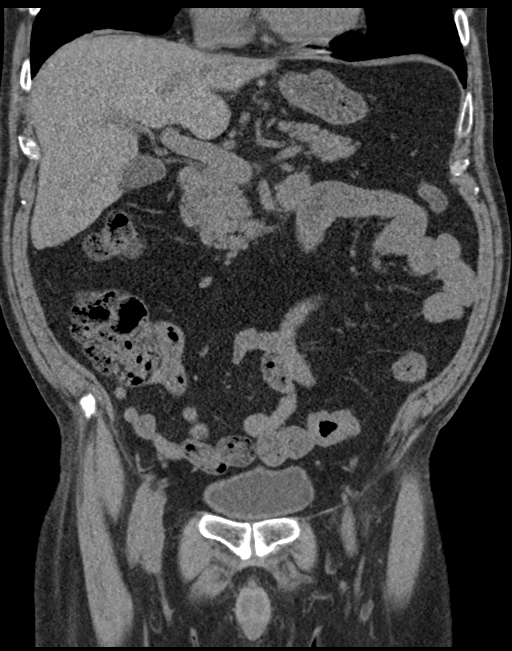
[im 74/133  soft-tissue]
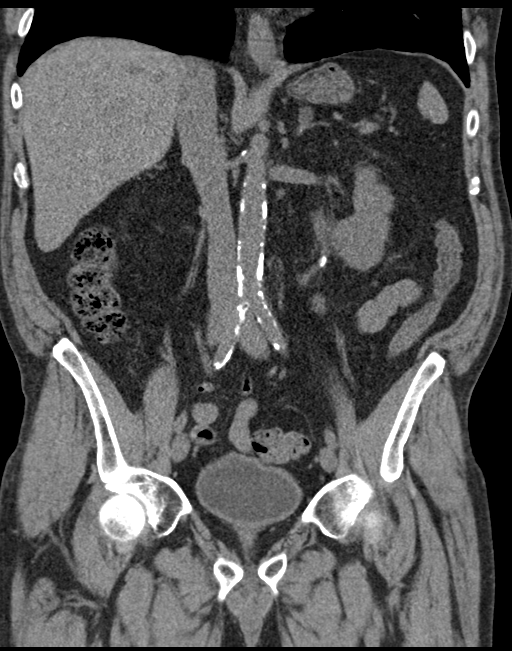

[15 of 46 positions shown; findings below may reference images not displayed]

FINDINGS: Lower chest: Emphysema areas of basilar scarring. No focal airspace
disease. There are coronary artery calcifications.

Hepatobiliary: No focal liver abnormality is seen. No gallstones,
gallbladder wall thickening, or biliary dilatation.

Pancreas: No ductal dilatation or inflammation.

Spleen: Normal in size without focal abnormality. Small splenule
inferiorly. These are unchanged from prior.

Adrenals/Urinary Tract: Similar left adrenal thickening without
dominant nodule. Normal right adrenal gland. Single versus 2
adjacent stones in the left proximal ureter measuring 7 x 5 mm with
mild hydronephrosis and perinephric edema. Additional nonobstructing
stones in the left kidney. More distal ureter is decompressed.

No right hydronephrosis or hydroureter. Calcifications at the right
renal hilum may represent nonobstructing stones or vascular
calcifications.

Partially distended urinary bladder without bladder wall thickening.
No bladder stone.

Stomach/Bowel: Nondistended stomach. No small bowel wall thickening,
obstruction or inflammation. Normal appendix. Small to moderate
colonic stool burden. Mild diverticulosis of the descending colon
without diverticulitis.

Vascular/Lymphatic: Moderate aorto bi-iliac atherosclerosis. No
aortic aneurysm. Few prominent proximal celiac nodes are not
enlarged by size criteria. Few prominent porta hepatis nodes.

Reproductive: Prostate is unremarkable.

Other: No ascites. No free air. Tiny fat containing umbilical
hernia.

Musculoskeletal: There are no acute or suspicious osseous
abnormalities.
IMPRESSION: 1. Obstructing single versus 2 adjacent stones in the left proximal
ureter measuring 7 x 5 mm with mild hydronephrosis and perinephric
edema.
2. Additional nonobstructing stones in the left kidney. Possible
nonobstructing right renal stone versus vascular calcification.
3. Minimal colonic diverticulosis without diverticulitis.

Aortic Atherosclerosis ([id number].X).

## 2021-03-18 ENCOUNTER — Telehealth: Payer: Self-pay | Admitting: Adult Health

## 2021-03-18 NOTE — Telephone Encounter (Signed)
Pt was transferred to access nurse and then was transferred back to office. Pt is having breathing issues and per pt was told to be seen within 4 hrs. There's no opening at office and pt was told to go to urgent care or er.Pt said he is not going to urgent care nor ER.  Pt said its not that bad and had made an appt with cory for 03-19-2021 for virtual appt and pt states he wants to see cory  not virtual. . Pt was made an appt for 03-20-2021. Pt is on a wait list for cancellation

## 2021-03-19 ENCOUNTER — Encounter: Payer: Self-pay | Admitting: Adult Health

## 2021-03-19 ENCOUNTER — Ambulatory Visit: Payer: BC Managed Care – PPO | Admitting: Adult Health

## 2021-03-19 ENCOUNTER — Other Ambulatory Visit: Payer: Self-pay | Admitting: Adult Health

## 2021-03-19 ENCOUNTER — Telehealth: Payer: BC Managed Care – PPO | Admitting: Adult Health

## 2021-03-19 VITALS — BP 120/62 | HR 77 | Temp 98.7°F | Ht 71.0 in | Wt 156.0 lb

## 2021-03-19 DIAGNOSIS — J440 Chronic obstructive pulmonary disease with acute lower respiratory infection: Secondary | ICD-10-CM

## 2021-03-19 DIAGNOSIS — J209 Acute bronchitis, unspecified: Secondary | ICD-10-CM

## 2021-03-19 MED ORDER — PREDNISONE 10 MG PO TABS: ORAL_TABLET | ORAL | 0 refills | Status: DC

## 2021-03-19 MED ORDER — IPRATROPIUM-ALBUTEROL 0.5-2.5 (3) MG/3ML IN SOLN: 3.0000 mL | Freq: Four times a day (QID) | RESPIRATORY_TRACT | 0 refills | Status: DC | PRN

## 2021-03-19 NOTE — Progress Notes (Signed)
Subjective:    Patient ID: Stephen Davila, male    DOB: 06-28-1960, 61 y.o.   MRN: [id number]  HPI  61 year old male who  has a past medical history of Complication of anesthesia, COPD (chronic obstructive pulmonary disease) ([place]), Hyperlipidemia, and Hypertension.  He is being evaluated today for shortness of breath.  [facility] with shortness of breath for 1 week.  He described it as occasionally feeling that he cannot get a full breath.  On occasion the chest will feel tight.  He reported a mild dry cough but no wheezing.  He does have a history of mild COPD that has been well controlled in the past.  At home he was using his albuterol nebulizer ( expired meds) which was helping with his symptoms.  Reports to urgent care yesterday, he had to leave before treatment could be completed because they were taking too long and he had an appointment to be.  Today he reports that he continues to have some mild difficulty breathing and tightness in his chest with a somewhat productive cough with clear sputum.  Has been using his inhalers as directed without improvement.  Feels though the cough may be getting worse.  Does not feel as though he is wheezing, has shortness of breath with exertion, fevers, or chills.   Review of Systems See HPI   Past Medical History:  Diagnosis Date   Complication of anesthesia    bad hangover   COPD (chronic obstructive pulmonary disease) ([place])    Hyperlipidemia    Hypertension     Social History   Socioeconomic History   Marital status: Married    Spouse name: Not on file   Number of children: Not on file   Years of education: Not on file   Highest education level: Not on file  Occupational History   Not on file  Tobacco Use   Smoking status: Former    Packs/day: 2.00    Years: 35.00    Pack years: 70.00    Types: Cigarettes    Quit date: 06/15/2009    Years since quitting: 11.7   Smokeless tobacco: Never   Vaping Use   Vaping Use: Not on file  Substance and Sexual Activity   Alcohol use: Yes    Alcohol/week: 42.0 standard drinks    Types: 42 Cans of beer per week    Comment: socially    Drug use: No   Sexual activity: Yes  Other Topics Concern   Not on file  Social History Narrative   [name] for 39 years    Married    No kids    Social Determinants of Health   Financial Resource Strain: Not on file  Food Insecurity: Not on file  Transportation Needs: Not on file  Physical Activity: Not on file  Stress: Not on file  Social Connections: Not on file  Intimate Partner Violence: Not on file    Past Surgical History:  Procedure Laterality Date   [place]. has screw in it   CERVICAL SPINE SURGERY  10-01-12   5,6,7   CYSTOSCOPY/URETEROSCOPY/HOLMIUM LASER/STENT PLACEMENT N/A 03/21/2019   Procedure: CYSTOSCOPYl/LEFT URETEROSCOPY/HOLMIUM LASER/RETROGRADE/BILATERAL STENT PLACEMENT/BLADDER RESECTION/RIGHT RETROGRADE;  Surgeon: Lucas Mallow, MD;  Location: [facility];  Service: Urology;  Laterality: N/A;    Family History  Problem Relation Age of Onset   Prostate cancer Father    Heart disease Father  Melanoma Sister    Arthritis Mother    Diabetes Neg Hx    Stroke Neg Hx    Kidney disease Neg Hx    Hypertension Neg Hx    Hyperlipidemia Neg Hx    Colon cancer Neg Hx    Pancreatic cancer Neg Hx    Stomach cancer Neg Hx     No Known Allergies  Current Outpatient Medications on File Prior to Visit  Medication Sig Dispense Refill   albuterol (PROAIR HFA) 108 (90 Base) MCG/ACT inhaler Inhale 1-2 puffs into the lungs every 6 (six) hours as needed for wheezing or shortness of breath. TAKE 2 PUFFS BY MOUTH EVERY 6 HOURS AS NEEDED FOR WHEEZE 8.5 each 2   azelastine (ASTELIN) 0.1 % nasal spray Place 2 sprays into both nostrils 2 (two) times daily. Use in each nostril as directed 30 mL 1   budesonide-formoterol (SYMBICORT) 80-4.5 MCG/ACT inhaler Inhale 2 puffs  into the lungs 2 (two) times daily. 30.6 each 0   fenofibrate micronized (LOFIBRA) 134 MG capsule TAKE 1 CAPSULE (134 MG TOTAL) BY MOUTH DAILY BEFORE BREAKFAST. 90 capsule 3   icosapent Ethyl (VASCEPA) 1 g capsule Take 2 capsules (2 g total) by mouth 2 (two) times daily. 360 capsule 3   rosuvastatin (CRESTOR) 40 MG tablet Take 1 tablet (40 mg total) by mouth daily. 90 tablet 3   telmisartan (MICARDIS) 40 MG tablet TAKE 1 TABLET BY MOUTH EVERY DAY 90 tablet 3   No current facility-administered medications on file prior to visit.    There were no vitals taken for this visit.      Objective:   Physical Exam Vitals and nursing note reviewed.  Constitutional:      Appearance: Normal appearance. He is well-developed.  Cardiovascular:     Rate and Rhythm: Normal rate and regular rhythm.     Pulses: Normal pulses.     Heart sounds: Normal heart sounds.  Pulmonary:     Breath sounds: No decreased air movement. Wheezing present. No decreased breath sounds, rhonchi or rales.     Comments: Trace expiratory wheeze throughout. No rhonchi, rales, or decreased breath sound Musculoskeletal:        General: Normal range of motion.  Skin:    General: Skin is warm and dry.  Neurological:     General: No focal deficit present.     Mental Status: He is alert and oriented to person, place, and time.  Psychiatric:        Mood and Affect: Mood normal.        Behavior: Behavior normal.        Thought Content: Thought content normal.        Judgment: Judgment normal.       Assessment & Plan:  1. Acute bronchitis with COPD ([place])  - predniSONE (DELTASONE) 10 MG tablet; 40 mg x 3 days, 20 mg x 3 days, 10 mg x 3 days  Dispense: 21 tablet; Refill: 0 - ipratropium-albuterol (DUONEB) 0.5-2.5 (3) MG/3ML SOLN; Take 3 mLs by nebulization every 6 (six) hours as needed.  Dispense: 360 mL; Refill: 0 - Follow up if not resolving in the next 2-3 days   Dorothyann Peng, NP

## 2021-03-19 NOTE — Telephone Encounter (Signed)
Spoke to pt and pt stated that he only has SOB. Sx started a week ago. Pt denied any Flu and Covid sx. Pt advised of 330pm and other appt. Has been canceled.

## 2021-03-19 NOTE — Telephone Encounter (Signed)
An opening is available for today at 330pm. I placed pt in the 330pm slot. Called pt to get more information to make sure in ov is appropiate and that pt was bale to make that appt. Pt did not answer will call again shortly.

## 2021-03-19 NOTE — Patient Instructions (Signed)
Your exam is consistent with bronchitis   I have sent in a steroid taper and some new nebulizer solution   Let me know if this does not resolve your symptoms in the next 3-4 days

## 2021-03-20 ENCOUNTER — Ambulatory Visit: Payer: BC Managed Care – PPO | Admitting: Adult Health

## 2021-03-20 ENCOUNTER — Telehealth: Payer: BC Managed Care – PPO | Admitting: Adult Health

## 2021-03-28 ENCOUNTER — Encounter: Payer: Self-pay | Admitting: Adult Health

## 2021-03-29 ENCOUNTER — Other Ambulatory Visit: Payer: Self-pay | Admitting: Adult Health

## 2021-03-29 MED ORDER — AZITHROMYCIN 250 MG PO TABS: ORAL_TABLET | ORAL | 0 refills | Status: AC

## 2021-04-03 ENCOUNTER — Encounter: Payer: Self-pay | Admitting: Adult Health

## 2021-04-12 ENCOUNTER — Telehealth: Payer: Self-pay

## 2021-04-12 NOTE — Telephone Encounter (Signed)
[name] (Key: [id number])  Vascepa 1GM capsules   Form Blue Building control surveyor Form (CB) Created 1 month ago Sent to Plan 29 days ago Plan Response 29 days ago Submit Clinical Questions 29 days ago Determination Favorable 28 days ago  Prior authorization for Stephen Davila has been approved.

## 2021-04-15 ENCOUNTER — Other Ambulatory Visit: Payer: Self-pay | Admitting: Adult Health

## 2021-04-15 DIAGNOSIS — J209 Acute bronchitis, unspecified: Secondary | ICD-10-CM

## 2021-04-18 ENCOUNTER — Other Ambulatory Visit: Payer: Self-pay

## 2021-04-18 ENCOUNTER — Encounter: Payer: Self-pay | Admitting: Internal Medicine

## 2021-04-18 ENCOUNTER — Ambulatory Visit: Payer: BC Managed Care – PPO | Admitting: Internal Medicine

## 2021-04-18 ENCOUNTER — Ambulatory Visit (INDEPENDENT_AMBULATORY_CARE_PROVIDER_SITE_OTHER): Payer: BC Managed Care – PPO

## 2021-04-18 DIAGNOSIS — J449 Chronic obstructive pulmonary disease, unspecified: Secondary | ICD-10-CM

## 2021-04-18 DIAGNOSIS — R058 Other specified cough: Secondary | ICD-10-CM

## 2021-04-18 LAB — CBC WITH DIFFERENTIAL/PLATELET
Basophils Absolute: 0.1 10*3/uL (ref 0.0–0.1)
Basophils Relative: 1 % (ref 0.0–3.0)
Eosinophils Absolute: 0.4 10*3/uL (ref 0.0–0.7)
Eosinophils Relative: 4.8 % (ref 0.0–5.0)
HCT: 42.7 % (ref 39.0–52.0)
Hemoglobin: 14.3 g/dL (ref 13.0–17.0)
Lymphocytes Relative: 25.6 % (ref 12.0–46.0)
Lymphs Abs: 2.1 10*3/uL (ref 0.7–4.0)
MCHC: 33.5 g/dL (ref 30.0–36.0)
MCV: 98.4 fl (ref 78.0–100.0)
Monocytes Absolute: 0.7 10*3/uL (ref 0.1–1.0)
Monocytes Relative: 9.2 % (ref 3.0–12.0)
Neutro Abs: 4.8 10*3/uL (ref 1.4–7.7)
Neutrophils Relative %: 59.4 % (ref 43.0–77.0)
Platelets: 325 10*3/uL (ref 150.0–400.0)
RBC: 4.34 Mil/uL (ref 4.22–5.81)
RDW: 12.6 % (ref 11.5–15.5)
WBC: 8.2 10*3/uL (ref 4.0–10.5)

## 2021-04-18 NOTE — Assessment & Plan Note (Signed)
Quit smoking 2011   - Alpha 1 genotype sent June 20, 2009 >> MM - PFT's Jul 18, 2009 FEV1 2.70 (76) ratio 55 and no response to B2 p am symbicort and DLC0 75%  C/w GOLD II  - Spirometry 11/29/2012 FEV1  3.22 (81%) ratio 68 s truncation on exp f/v so ok to taper off symbicort    When respiratory symptoms begin or become refractory well after a patient reports complete smoking cessation,  Especially when this wasn't the case while they were smoking, a red flag is raised based on the work of Dr Kris Mouton which states:  if you quit smoking when your best day FEV1 is still well preserved it is highly unlikely you will progress to severe disease.  That is to say, once the smoking stops,  the symptoms should not suddenly erupt or markedly worsen.  If so, the differential diagnosis should include  obesity/deconditioning,  LPR/Reflux/Aspiration syndromes,  occult CHF, or  especially side effect of medications commonly used in this population.      rec: No increase symbicort for  Now  - continue low dose ICS as he's not found any benefit in even prednisone to date and high dose ics may aggravate UACS from gerd  (see separate a/p)

## 2021-04-18 NOTE — Patient Instructions (Signed)
Plan A = Automatic = Always=    Symbicort 80 Take 2 puffs first thing in am and then another 2 puffs about 12 hours later.    Work on inhaler technique:  relax and gently blow all the way out then take a nice smooth full deep breath back in, triggering the inhaler at same time you start breathing in.  Hold for up to 5 seconds if you can. Blow out thru nose. Rinse and gargle with water when done.  If mouth or throat bother you at all,  try brushing teeth/gums/tongue with arm and hammer toothpaste/ make a slurry and gargle and spit out.       Plan B = Backup (to supplement plan A, not to replace it) Only use your albuterol inhaler as a rescue medication to be used if you can't catch your breath by resting or doing a relaxed purse lip breathing pattern.  - The less you use it, the better it will work when you need it. - Ok to use the inhaler up to 2 puffs  every 4 hours if you must but call for appointment if use goes up over your usual need - Don't leave home without it !!  (think of it like the spare tire for your car)   Plan C = Crisis (instead of Plan B but only if Plan B stops working) - only use your albuterol nebulizer if you first try Plan B and it fails to help > ok to use the nebulizer up to every 4 hours but if start needing it regularly call for immediate appointment    Pantoprazole (protonix) 40 mg   Take  30-60 min before first meal of the day and Pepcid (famotidine)  20 mg after supper until return to office - this is the best way to tell whether stomach acid is contributing to your problem.   GERD (REFLUX)  is an extremely common cause of respiratory symptoms just like yours , many times with no obvious heartburn at all.    It can be treated with medication, but also with lifestyle changes including elevation of the head of your bed (ideally with 6 -8inch blocks under the headboard of your bed),  Smoking cessation, avoidance of late meals, excessive alcohol, and avoid fatty foods,  chocolate, peppermint, colas, red wine, and acidic juices such as orange juice.  NO MINT OR MENTHOL PRODUCTS SO NO COUGH DROPS other than Luden's USE SUGARLESS CANDY INSTEAD (Jolley ranchers or Stover's or Life Savers) or even ice chips will also do - the key is to swallow to prevent all throat clearing.  NO OIL BASED VITAMINS - use powdered substitutes.  Avoid fish oil when coughing.    Please remember to go to the lab and x-ray department  for your tests - we will call you with the results when they are available.       I will be referring you to ENT for your nose and throat evaluation  Please schedule a follow up office visit in 6 weeks, call sooner if needed with PFTs on return

## 2021-04-18 NOTE — Progress Notes (Signed)
Subjective:    Patient ID: Stephen Davila, male    DOB: 08/27/1960    MRN: [id number]    Brief patient profile:  37 yowm quit smoking April 2011 with Moderate COPD by PFT's Jul 18, 2009  07/2004 pulm eval for severe cough   GOLD I copd by spirometry 11/29/12     History of Present Illness  11/17/2012 NP Pt has been having increased SOB and chest tightness x4 days. Pt denies any wheezing, cough, congestion, f/c/s, n/v. Complains of SOB x3 days. Denies tightness in chest, wheezing or f/c/s. Started crestor 10 mg 11/09/12 and stopped 11/13/12 due to unsure if causing SOB - no tongue swelling or stridor or wheezing He had neck surgery by Dr Stephen Davila 10/01/12 Cx 5-7 and difficulty swallowing rec Albuterol MDI sample- 2 puffs q 6h as needed Prednisone 10 mg x 7ds Take pepcid daily x 2 weeks   11/29/2012 f/u ov/Stephen Davila re:sob p neck surgergy no better p rx as above Chief Complaint  Patient presents with   Acute Visit    Pt c/o "tightness in throat" x 2 wks. He also c/o minimal cough, non prod. He has been using albuterol inhaler 2-3 times daily   Not really better with saba, not worse with exertion, fullness in throat 24/7 but not worse with exertion. No assoc  neck pain or choking on food.  rec Pantoprazole (protonix) 40 mg   Take 30-60 min before first meal of the day and Pepcid 20 mg one bedtime until return to office - this is the best way to tell whether stomach acid is contributing to your problem.   Please remember to go to the  x-ray department downstairs for your tests - we will call you with the results when they are available. The next step if not improving is a CT of your neck > call us or Dr Stephen Davila to schedule GERD diet   12/15/2012 f/u ov/Stephen Davila re: GOLD I copd  ? Also UACS Chief Complaint  Patient presents with   Follow-up    Pt states he is improving, minimal cough and throat clearing. He has used rescue inhaler twice in the past wk.   still feels urge to clear throat esp p  talking but not p sleeping  > white mucus, wakes up nasal congestion problem is year round x decade  Breathing is not limiting at all  Rec Symbicort 80 (or dulera 100)Take 2 puffs first thing in am and then another 2 puffs about 12 hours later only you are having trouble with your breathing For the throat rec stay on acid suppression and diet. GERD diet reviewed, bed blocks       04/18/2021   Re-establish  ov/Stephen Davila re: copd/uacs  worse sob maint on symb 80 2bid   Chief Complaint  Patient presents with   Consult    C/o sob getting worse gradually x 5 yrs.,cough-clear,yellow-brown  Dyspnea:  no trouble with nl activities or steps unless multiple flights Cough: mostly in am's > variably yellow ? Worse in winter / mild globus but no assoc dysphagia  Sleeping: no symptoms while sleeping flat  SABA use: seems to help  02: none  Covid status:   vax x 3  Nose is stuffy/ clear watery rhinitis assoc globus and voice fatigue - using lots of mints   No obvious day to day or daytime variability or assoc  sputum or mucus  hemoptysis or cp or chest tightness, subjective wheeze or overt sinus or hb symptoms.  Sleeping  without nocturnal   exacerbation  of respiratory  c/o's or need for noct saba. Also denies any obvious fluctuation of symptoms with weather or environmental changes or other aggravating or alleviating factors except as outlined above.  No unusual exposure hx or h/o childhood pna/ asthma or knowledge of premature birth.  Current Allergies, Complete Past Medical History, Past Surgical History, Family History, and Social History were reviewed in [employer] record.  ROS  The following are not active complaints unless bolded Hoarseness, sore throat, dysphagia, dental problems, itching, sneezing,  nasal congestion or discharge of excess mucus or purulent secretions, ear ache,   fever, chills, sweats, unintended wt loss or wt gain, classically pleuritic or exertional cp,   orthopnea pnd or arm/hand swelling  or leg swelling, presyncope, palpitations, abdominal pain, anorexia, nausea, vomiting, diarrhea  or change in bowel habits or change in bladder habits, change in stools or change in urine, dysuria, hematuria,  rash, arthralgias, visual complaints, headache, numbness, weakness or ataxia or problems with walking or coordination,  change in mood or  memory.        Current Meds  Medication Sig   albuterol (PROAIR HFA) 108 (90 Base) MCG/ACT inhaler Inhale 1-2 puffs into the lungs every 6 (six) hours as needed for wheezing or shortness of breath. TAKE 2 PUFFS BY MOUTH EVERY 6 HOURS AS NEEDED FOR WHEEZE   budesonide-formoterol (SYMBICORT) 80-4.5 MCG/ACT inhaler Inhale 2 puffs into the lungs 2 (two) times daily.   fenofibrate micronized (LOFIBRA) 134 MG capsule TAKE 1 CAPSULE (134 MG TOTAL) BY MOUTH DAILY BEFORE BREAKFAST.   icosapent Ethyl (VASCEPA) 1 g capsule Take 2 capsules (2 g total) by mouth 2 (two) times daily.   ipratropium-albuterol (DUONEB) 0.5-2.5 (3) MG/3ML SOLN USE 3 ML VIA NEBULIZER EVERY 6 HOURS AS NEEDED   rosuvastatin (CRESTOR) 40 MG tablet Take 1 tablet (40 mg total) by mouth daily.   telmisartan (MICARDIS) 40 MG tablet TAKE 1 TABLET BY MOUTH EVERY DAY            Past Medical History:  COPD  - Alpha 1 genotype sent June 20, 2009 >> MM  - PFT's Jul 18, 2009 FEV1 2.70 (76) ratio 55 and no response to BD p am symbicort and DLC0 75%        Objective:   Physical Exam  Wts  04/18/2021       156   12/15/2012     167     11/17/12 163 lb 9.6 oz (74.208 kg)  11/03/12 164 lb 1.9 oz (74.444 kg)  09/18/12 171 lb (77.565 kg)    Vital signs reviewed  04/18/2021  - Note at rest 02 sats  97% on RA   General appearance:    amb wm nad with pseudowheeze   HEENT : pt wearing mask not removed for exam due to covid - 19 concerns.   NECK :  without JVD/Nodes/TM/ nl carotid upstrokes bilaterally   LUNGS: no acc muscle use,  Min barrel  contour chest  wall with bilateral  slightly decreased bs s audible wheeze and  without cough on insp or exp maneuvers and min  Hyperresonant  to  percussion bilaterally     CV:  RRR  no s3 or murmur or increase in P2, and no edema   ABD:  soft and nontender with pos end  insp Hoover's  in the supine position. No bruits or organomegaly appreciated, bowel sounds nl  MS:   Nl gait/  ext warm without deformities, calf tenderness, cyanosis  ? Mild clubbing No obvious joint restrictions   SKIN: warm and dry without lesions    NEURO:  alert, approp, nl sensorium with  no motor or cerebellar deficits apparent.         CXR PA and Lateral:   04/18/2021 :    I personally reviewed images and agree with radiology impression as follows:     No active cardiopulmonary disease.  Probable COPD.          Assessment & Plan:

## 2021-04-18 NOTE — Assessment & Plan Note (Addendum)
S/p Cx neck surgery 10/01/12 @ [facility]  - worseing sob with voice fatigue and throat tightness since 2018 worse p chiropractor manipulation of neck > ent eval req 04/18/2021 >>> - Allergy profile 04/18/21  >  Eos 0.4 /  IgE  274> add singulair    - max gerd rx 04/18/2021 >>>   Will see back for full pfts with f/v loop in 6 weeks  Discussed in detail all the  indications, usual  risks and alternatives  relative to the benefits with patient who agrees to proceed with Rx as outlined and if not productive then do allergy eval next .             Each maintenance medication was reviewed in detail including emphasizing most importantly the difference between maintenance and prns and under what circumstances the prns are to be triggered using an action plan format where appropriate.  Total time for H and P, chart review, counseling, reviewing hfa  device(s) and generating customized AVS unique to this office visit / same day charting > 45 min for pt not seen in > 3y

## 2021-04-19 ENCOUNTER — Encounter: Payer: Self-pay | Admitting: Internal Medicine

## 2021-04-19 ENCOUNTER — Telehealth: Payer: Self-pay | Admitting: Internal Medicine

## 2021-04-19 LAB — IGE: IgE (Immunoglobulin E), Serum: 274 kU/L — ABNORMAL HIGH (ref <=114)

## 2021-04-19 MED ORDER — PANTOPRAZOLE SODIUM 40 MG PO TBEC: 40.0000 mg | DELAYED_RELEASE_TABLET | Freq: Every day | ORAL | 11 refills | Status: DC

## 2021-04-19 MED ORDER — FAMOTIDINE 20 MG PO TABS: 20.0000 mg | ORAL_TABLET | Freq: Every day | ORAL | 11 refills | Status: DC

## 2021-04-19 NOTE — Telephone Encounter (Signed)
Mychart messages were responded to and pt has been made aware of the responses. Meds were sent to pharmacy for pt. Nothing further needed.

## 2021-04-22 ENCOUNTER — Telehealth: Payer: Self-pay | Admitting: Internal Medicine

## 2021-04-22 ENCOUNTER — Other Ambulatory Visit (HOSPITAL_COMMUNITY): Payer: Self-pay

## 2021-04-23 ENCOUNTER — Encounter: Payer: Self-pay | Admitting: Internal Medicine

## 2021-04-23 ENCOUNTER — Telehealth: Payer: Self-pay | Admitting: Pharmacy Technician

## 2021-04-23 NOTE — Telephone Encounter (Signed)
We have received notice for PA and it has been submitted

## 2021-04-23 NOTE — Telephone Encounter (Signed)
Patient Advocate Encounter  Received notification from [facility]) that prior authorization for PANTOPRAZOLE 40MG  is required.   PA submitted on 2.14.23 Key [id number] Status is pending   [facility] will continue to follow  Luciano Cutter, CPhT Patient Advocate Phone: [phone] Fax:  [phone]

## 2021-04-24 ENCOUNTER — Ambulatory Visit (INDEPENDENT_AMBULATORY_CARE_PROVIDER_SITE_OTHER): Payer: BC Managed Care – PPO | Admitting: Internal Medicine

## 2021-04-24 ENCOUNTER — Other Ambulatory Visit (HOSPITAL_COMMUNITY): Payer: Self-pay

## 2021-04-24 ENCOUNTER — Other Ambulatory Visit: Payer: Self-pay

## 2021-04-24 ENCOUNTER — Encounter: Payer: Self-pay | Admitting: Internal Medicine

## 2021-04-24 DIAGNOSIS — J449 Chronic obstructive pulmonary disease, unspecified: Secondary | ICD-10-CM

## 2021-04-24 LAB — PULMONARY FUNCTION TEST
DL/VA % pred: 73 %
DL/VA: 3.07 ml/min/mmHg/L
DLCO cor % pred: 68 %
DLCO cor: 19.17 ml/min/mmHg
DLCO unc % pred: 67 %
DLCO unc: 19 ml/min/mmHg
FEF 25-75 Post: 1.51 L/sec
FEF 25-75 Pre: 1.11 L/sec
FEF2575-%Change-Post: 35 %
FEF2575-%Pred-Post: 50 %
FEF2575-%Pred-Pre: 37 %
FEV1-%Change-Post: 11 %
FEV1-%Pred-Post: 76 %
FEV1-%Pred-Pre: 68 %
FEV1-Post: 2.81 L
FEV1-Pre: 2.52 L
FEV1FVC-%Change-Post: 7 %
FEV1FVC-%Pred-Pre: 78 %
FEV6-%Change-Post: 4 %
FEV6-%Pred-Post: 92 %
FEV6-%Pred-Pre: 89 %
FEV6-Post: 4.31 L
FEV6-Pre: 4.14 L
FEV6FVC-%Change-Post: -1 %
FEV6FVC-%Pred-Post: 102 %
FEV6FVC-%Pred-Pre: 103 %
FVC-%Change-Post: 4 %
FVC-%Pred-Post: 90 %
FVC-%Pred-Pre: 87 %
FVC-Post: 4.42 L
FVC-Pre: 4.24 L
Post FEV1/FVC ratio: 64 %
Post FEV6/FVC ratio: 98 %
Pre FEV1/FVC ratio: 59 %
Pre FEV6/FVC Ratio: 99 %
RV % pred: 63 %
RV: 1.48 L
TLC % pred: 86 %
TLC: 6.23 L

## 2021-04-24 NOTE — Telephone Encounter (Signed)
Dr. Melvyn Novas, please advise on pt's email - he is asking to change his current therapy to Symbicort. He also wants his results of the PFT. Pt has OV on 3/7 with you to review results (no sooner opening with MW). Thank you.

## 2021-04-24 NOTE — Progress Notes (Signed)
PFT done today. 

## 2021-04-24 NOTE — Telephone Encounter (Signed)
Patient Advocate Encounter  Prior Authorization for Pantoprazole 40mg  tabs has been approved.    PA# [id number]  Effective dates: 04/23/21 through 04/22/22  Refill too soon.  Spoke with Pharmacy to Process.  Patient Advocate Fax: [phone]

## 2021-04-25 ENCOUNTER — Other Ambulatory Visit: Payer: Self-pay | Admitting: Internal Medicine

## 2021-04-25 MED ORDER — MONTELUKAST SODIUM 10 MG PO TABS: 10.0000 mg | ORAL_TABLET | Freq: Every day | ORAL | 11 refills | Status: DC

## 2021-04-25 NOTE — Telephone Encounter (Signed)
Patient was notified of his results earlier today.

## 2021-04-25 NOTE — Progress Notes (Signed)
Spoke with pt and notified of results per Dr. Wert. Pt verbalized understanding and denied any questions. 

## 2021-04-25 NOTE — Telephone Encounter (Signed)
Ok to send in rx for symbicort 160 Take 2 puffs first thing in am and then another 2 puffs about 12 hours later with 5 refills

## 2021-04-26 MED ORDER — BUDESONIDE-FORMOTEROL FUMARATE 160-4.5 MCG/ACT IN AERO: 2.0000 | INHALATION_SPRAY | Freq: Two times a day (BID) | RESPIRATORY_TRACT | 5 refills | Status: DC

## 2021-05-01 ENCOUNTER — Other Ambulatory Visit: Payer: Self-pay | Admitting: Adult Health

## 2021-05-01 DIAGNOSIS — J449 Chronic obstructive pulmonary disease, unspecified: Secondary | ICD-10-CM

## 2021-05-13 ENCOUNTER — Encounter: Payer: Self-pay | Admitting: Internal Medicine

## 2021-05-13 ENCOUNTER — Ambulatory Visit: Payer: BC Managed Care – PPO | Admitting: Internal Medicine

## 2021-05-13 ENCOUNTER — Other Ambulatory Visit: Payer: Self-pay

## 2021-05-13 DIAGNOSIS — J449 Chronic obstructive pulmonary disease, unspecified: Secondary | ICD-10-CM

## 2021-05-13 DIAGNOSIS — R058 Other specified cough: Secondary | ICD-10-CM | POA: Diagnosis not present

## 2021-05-13 MED ORDER — BUDESONIDE-FORMOTEROL FUMARATE 80-4.5 MCG/ACT IN AERO: INHALATION_SPRAY | RESPIRATORY_TRACT | 12 refills | Status: DC

## 2021-05-13 NOTE — Progress Notes (Signed)
Subjective:    Patient ID: Stephen Davila, male    DOB: January 25, 1961    MRN: [id number]    Brief patient profile:  31 yowm quit smoking April 2011 with Moderate COPD by PFT's Jul 18, 2009  07/2004 pulm eval for severe cough   GOLD I copd by spirometry 11/29/12     History of Present Illness  11/17/2012 NP Pt has been having increased SOB and chest tightness x4 days. Pt denies any wheezing, cough, congestion, f/c/s, n/v. Complains of SOB x3 days. Denies tightness in chest, wheezing or f/c/s. Started crestor 10 mg 11/09/12 and stopped 11/13/12 due to unsure if causing SOB - no tongue swelling or stridor or wheezing He had neck surgery by Dr Stephen Davila 10/01/12 Cx 5-7 and difficulty swallowing rec Albuterol MDI sample- 2 puffs q 6h as needed Prednisone 10 mg x 7ds Take pepcid daily x 2 weeks   11/29/2012 f/u ov/Stephen Davila re:sob p neck surgergy no better p rx as above Chief Complaint  Patient presents with   Acute Visit    Pt c/o "tightness in throat" x 2 wks. He also c/o minimal cough, non prod. He has been using albuterol inhaler 2-3 times daily   Not really better with saba, not worse with exertion, fullness in throat 24/7 but not worse with exertion. No assoc  neck pain or choking on food.  rec Pantoprazole (protonix) 40 mg   Take 30-60 min before first meal of the day and Pepcid 20 mg one bedtime until return to office - this is the best way to tell whether stomach acid is contributing to your problem.   Please remember to go to the  x-ray department downstairs for your tests - we will call you with the results when they are available. The next step if not improving is a CT of your neck > call us or Dr Stephen Davila to schedule GERD diet   12/15/2012 f/u ov/Stephen Davila re: GOLD I copd  ? Also UACS Chief Complaint  Patient presents with   Follow-up    Pt states he is improving, minimal cough and throat clearing. He has used rescue inhaler twice in the past wk.   still feels urge to clear throat esp p  talking but not p sleeping  > white mucus, wakes up nasal congestion problem is year round x decade  Breathing is not limiting at all  Rec Symbicort 80 (or dulera 100)Take 2 puffs first thing in am and then another 2 puffs about 12 hours later only you are having trouble with your breathing For the throat rec stay on acid suppression and diet. GERD diet reviewed, bed blocks       04/18/2021   Re-establish  ov/Stephen Davila re: copd/uacs  worse sob maint on symb 80 2bid   Chief Complaint  Patient presents with   Consult    C/o sob getting worse gradually x 5 yrs.,cough-clear,yellow-brown  Dyspnea:  no trouble with nl activities or steps unless multiple flights Cough: mostly in am's > variably yellow ? Worse in winter / mild globus but no assoc dysphagia  Sleeping: no symptoms while sleeping flat  SABA use: seems to help  02: none  Covid status:   vax x 3  Nose is stuffy/ clear watery rhinitis assoc globus and voice fatigue - using lots of mints  Rec Plan A = Automatic = Always=    Symbicort 80 Take 2 puffs first thing in am and then another 2 puffs about 12 hours later.  Work on inhaler technique:   Plan B = Backup (to supplement plan A, not to replace it) Only use your albuterol inhaler as a rescue medication Plan C = Crisis (instead of Plan B but only if Plan B stops working) - only use your albuterol nebulizer if you first try Plan B  Pantoprazole (protonix) 40 mg   Take  30-60 min before first meal of the day and Pepcid (famotidine)  20 mg after supper until return to office GERD diet reviewed, bed blocks rec  I will be referring you to ENT for your nose and throat evaluation Please schedule a follow up office visit in 6 weeks, call sooner if needed with PFTs on return   - Allergy profile 04/18/21  >  Eos 0.4 /  IgE  274  add singulair       05/13/2021  f/u ov/Stephen Davila re: copd    maint on symb 160   Chief Complaint  Patient presents with   Follow-up    Breathing not improved  since the  last visit. He is not using rescue inhaler "Does not seem like it helps". He is using duoneb a couple times per wk on average.   Dyspnea:  no problem with activity, more sob at rest at this point/ variable Cough: still clearing throat a lot/ no excess or purulent  mucus Sleeping:  sleeping flat bed/ one pillow  02: none  Thinks neb may help  a little p sits down, hfa not helpful ? Worse on higher dose symbicort      No obvious day to day or daytime variability or assoc   mucus plugs or hemoptysis or cp or chest tightness, subjective wheeze or overt sinus or hb symptoms.   Sleeping fine without nocturnal  or early am exacerbation  of respiratory  c/o's or need for noct saba. Also denies any obvious fluctuation of symptoms with weather or environmental changes or other aggravating or alleviating factors except as outlined above   No unusual exposure hx or h/o childhood pna/ asthma or knowledge of premature birth.  Current Allergies, Complete Past Medical History, Past Surgical History, Family History, and Social History were reviewed in [facility] record.  ROS  The following are not active complaints unless bolded Hoarseness, sore throat, dysphagia, dental problems, itching, sneezing,  nasal congestion or discharge of excess mucus or purulent secretions, ear ache,   fever, chills, sweats, unintended wt loss or wt gain, classically pleuritic or exertional cp,  orthopnea pnd or arm/hand swelling  or leg swelling, presyncope, palpitations, abdominal pain, anorexia, nausea, vomiting, diarrhea  or change in bowel habits or change in bladder habits, change in stools or change in urine, dysuria, hematuria,  rash, arthralgias, visual complaints, headache, numbness, weakness or ataxia or problems with walking or coordination,  change in mood or  memory.        Current Meds  Medication Sig   albuterol (PROAIR HFA) 108 (90 Base) MCG/ACT inhaler Inhale 1-2 puffs into the lungs every  6 (six) hours as needed for wheezing or shortness of breath. TAKE 2 PUFFS BY MOUTH EVERY 6 HOURS AS NEEDED FOR WHEEZE   azelastine (ASTELIN) 0.1 % nasal spray Place 2 sprays into both nostrils 2 (two) times daily. Use in each nostril as directed   budesonide-formoterol (SYMBICORT) 160-4.5 MCG/ACT inhaler Inhale 2 puffs into the lungs in the morning and at bedtime.   famotidine (PEPCID) 20 MG tablet Take 1 tablet (20 mg total) by mouth at  bedtime.   fenofibrate micronized (LOFIBRA) 134 MG capsule TAKE 1 CAPSULE (134 MG TOTAL) BY MOUTH DAILY BEFORE BREAKFAST.   icosapent Ethyl (VASCEPA) 1 g capsule Take 2 capsules (2 g total) by mouth 2 (two) times daily.   ipratropium-albuterol (DUONEB) 0.5-2.5 (3) MG/3ML SOLN USE 3 ML VIA NEBULIZER EVERY 6 HOURS AS NEEDED   montelukast (SINGULAIR) 10 MG tablet Take 1 tablet (10 mg total) by mouth at bedtime.   pantoprazole (PROTONIX) 40 MG tablet Take 1 tablet (40 mg total) by mouth daily.   rosuvastatin (CRESTOR) 40 MG tablet Take 1 tablet (40 mg total) by mouth daily.   telmisartan (MICARDIS) 40 MG tablet TAKE 1 TABLET BY MOUTH EVERY DAY             Past Medical History:  COPD  - Alpha 1 genotype sent June 20, 2009 >> MM  - PFT's Jul 18, 2009 FEV1 2.70 (76) ratio 55 and no response to BD p am symbicort and DLC0 75%        Objective:   Physical Exam  Wts  05/13/2021       153  04/18/2021       156   12/15/2012     167     11/17/12 163 lb 9.6 oz (74.208 kg)  11/03/12 164 lb 1.9 oz (74.444 kg)  09/18/12 171 lb (77.565 kg)    Vital signs reviewed  05/13/2021  - Note at rest 02 sats  96% on RA   General appearance:    pleasant wm nad with prominent upper airway wheeze   HEENT : pt wearing mask not removed for exam due to covid - 19 concerns.   NECK :  without JVD/Nodes/TM/ nl carotid upstrokes bilaterally   LUNGS: no acc muscle use,  Min barrel  contour chest wall with bilateral  slightly decreased bs s audible wheeze and  without cough on insp  or exp maneuvers and min  Hyperresonant  to  percussion bilaterally     CV:  RRR  no s3 or murmur or increase in P2, and no edema   ABD:  soft and nontender with pos end  insp Hoover's  in the supine position. No bruits or organomegaly appreciated, bowel sounds nl  MS:   Nl gait/  ext warm without deformities, calf tenderness, cyanosis - Mild clubbing - No obvious joint restrictions   SKIN: warm and dry without lesions    NEURO:  alert, approp, nl sensorium with  no motor or cerebellar deficits apparent.                 Assessment & Plan:

## 2021-05-13 NOTE — Patient Instructions (Signed)
Resume symbicort 80 Take 2 puffs first thing in am and then another 2 puffs about 12 hours later.  ?  ?Keep your appointment with ENT  to evaluate your throat wheezing  ? ?We will refer you for lung cancer screening  ? ?If eval ENT is negative call for referral to allergy  ?

## 2021-05-14 ENCOUNTER — Encounter: Payer: Self-pay | Admitting: Internal Medicine

## 2021-05-14 NOTE — Assessment & Plan Note (Signed)
S/p Cx neck surgery 10/01/12 @ [facility]  ?- worseing sob with voice fatigue and throat tightness since 2018 worse p chiropractor manipulation of neck > ent eval req 04/18/2021 >>>  ?- max gerd rx 04/18/2021 >>>  ?- Allergy profile 04/18/21  >  Eos 0.4 /  IgE  274  added singulair  > no change 05/13/2021   ? ?Prominent upper airway wheezing > ent eval next week ? ?    ?  ? ?Each maintenance medication was reviewed in detail including emphasizing most importantly the difference between maintenance and prns and under what circumstances the prns are to be triggered using an action plan format where appropriate. ? ?Total time for H and P, chart review, counseling, reviewing hfa device(s) and generating customized AVS unique to this office visit / same day charting =31 min  ?     ?

## 2021-05-14 NOTE — Assessment & Plan Note (Signed)
Quit smoking 2011 ?  - Alpha 1 genotype sent June 20, 2009 >> MM ?- PFT's Jul 18, 2009 FEV1 2.70 (76) ratio 55 and no response to B2 p am symbicort and DLC0 75%  C/w GOLD II  ?- Spirometry 11/29/2012 FEV1  3.22 (81%) ratio 68 s truncation on exp f/v so ok to taper off symbicort ?- PFT's 04/24/2021   FEV1 2.81 (76 % ) ratio 0.64  p 11 % improvement from saba p 0 prior to study with DLCO  19.00 (67%) and FV curve classic concavity    ?- 05/13/2021 resume symbicort 80 2bid  ? ?Symptoms more noticeable at rest and variable, not reproduced with ex in pt with relatively well preserved FEV1 s/p remote smoking cessation with major upper airway features by hx and exam so rec max rx for gerd, drop symb to 80 2bid to reduce uuper airway irritation then proceed with ENT / allergy eval next steps  ? ?Also: ?Low-dose CT lung cancer screening is recommended for patients who ?are 96-56 years of age with a 20+ pack-year history of smoking and ?who are currently smoking or quit <=15 years ago. ?No coughing up blood  ?No unintentional weight loss of > 15 pounds in the last 6 months  ?>>> eligible for low dose CT scanning > referred for early decision making   ?

## 2021-07-21 ENCOUNTER — Other Ambulatory Visit: Payer: Self-pay | Admitting: Cardiovascular Disease

## 2021-07-21 DIAGNOSIS — I10 Essential (primary) hypertension: Secondary | ICD-10-CM

## 2021-07-21 DIAGNOSIS — I2510 Atherosclerotic heart disease of native coronary artery without angina pectoris: Secondary | ICD-10-CM

## 2021-07-23 ENCOUNTER — Other Ambulatory Visit: Payer: Self-pay | Admitting: Adult Health

## 2021-07-23 DIAGNOSIS — J209 Acute bronchitis, unspecified: Secondary | ICD-10-CM

## 2022-02-21 ENCOUNTER — Other Ambulatory Visit: Payer: Self-pay | Admitting: Adult Health

## 2022-02-21 DIAGNOSIS — J449 Chronic obstructive pulmonary disease, unspecified: Secondary | ICD-10-CM

## 2022-02-21 NOTE — Telephone Encounter (Signed)
30 days refilled. Pt is due for CPE

## 2022-03-18 ENCOUNTER — Other Ambulatory Visit: Payer: Self-pay | Admitting: Adult Health

## 2022-03-18 DIAGNOSIS — J449 Chronic obstructive pulmonary disease, unspecified: Secondary | ICD-10-CM

## 2022-03-19 ENCOUNTER — Other Ambulatory Visit (HOSPITAL_COMMUNITY): Payer: Self-pay

## 2022-03-19 ENCOUNTER — Telehealth: Payer: Self-pay

## 2022-03-19 NOTE — Telephone Encounter (Signed)
PA renewal initiated automatically by CoverMyMeds.  Submitted a Prior Authorization request to [facility] for Vascepa 1GM capsules via CoverMyMeds. Will update once we receive a response.   Key: [id number]

## 2022-03-20 NOTE — Telephone Encounter (Signed)
Patient Advocate Encounter  Prior Authorization for Vascepa 1GM capsules has been approved.    Key: [id number] Effective dates:03/19/22 through 03/18/23

## 2022-05-07 ENCOUNTER — Encounter: Payer: Self-pay | Admitting: *Deleted

## 2022-06-12 ENCOUNTER — Telehealth: Payer: BC Managed Care – PPO | Admitting: Urgent Care

## 2022-06-12 DIAGNOSIS — J019 Acute sinusitis, unspecified: Secondary | ICD-10-CM | POA: Diagnosis not present

## 2022-06-12 DIAGNOSIS — B9789 Other viral agents as the cause of diseases classified elsewhere: Secondary | ICD-10-CM | POA: Diagnosis not present

## 2022-06-12 MED ORDER — MONTELUKAST SODIUM 10 MG PO TABS: 10.0000 mg | ORAL_TABLET | Freq: Every day | ORAL | 0 refills | Status: DC

## 2022-06-12 MED ORDER — PREDNISONE 50 MG PO TABS: 50.0000 mg | ORAL_TABLET | Freq: Every day | ORAL | 0 refills | Status: DC

## 2022-06-12 MED ORDER — FLUTICASONE PROPIONATE 50 MCG/ACT NA SUSP: 2.0000 | Freq: Every day | NASAL | 0 refills | Status: DC

## 2022-06-12 NOTE — Progress Notes (Signed)
Virtual Visit Consent   Stephen Davila, you are scheduled for a virtual visit with a [facility] provider today. Just as with appointments in the office, your consent must be obtained to participate. Your consent will be active for this visit and any virtual visit you may have with one of our providers in the next 365 days. If you have a MyChart account, a copy of this consent can be sent to you electronically.  As this is a virtual visit, video technology does not allow for your provider to perform a traditional examination. This may limit your provider's ability to fully assess your condition. If your provider identifies any concerns that need to be evaluated in person or the need to arrange testing (such as labs, EKG, etc.), we will make arrangements to do so. Although advances in technology are sophisticated, we cannot ensure that it will always work on either your end or our end. If the connection with a video visit is poor, the visit may have to be switched to a telephone visit. With either a video or telephone visit, we are not always able to ensure that we have a secure connection.  By engaging in this virtual visit, you consent to the provision of healthcare and authorize for your insurance to be billed (if applicable) for the services provided during this visit. Depending on your insurance coverage, you may receive a charge related to this service.  I need to obtain your verbal consent now. Are you willing to proceed with your visit today? Stephen Davila has provided verbal consent on 06/12/2022 for a virtual visit (video or telephone). Chaney Malling, PA  Date: 06/12/2022 8:55 PM  Virtual Visit via Video Note   I, [place], connected with  Stephen Davila  ([id number], [birth year]) on 06/12/22 at  7:15 PM EDT by a video-enabled telemedicine application and verified that I am speaking with the correct person using two identifiers.  Location: Patient:  Virtual Visit Location Patient: Home Provider: Virtual Visit Location Provider: Home Office   I discussed the limitations of evaluation and management by telemedicine and the availability of in person appointments. The patient expressed understanding and agreed to proceed.    History of Present Illness: Stephen Davila is a 62 y.o. who identifies as a male who was assigned male at birth, and is being seen today for sinus congestion.  HPI: 62yo male presents with sinus congestion, rhinorrhea, post nasal drainage and cough in the mornings that gets better through the day. Pt takes albuterol and azelastine on a daily basis for allergy like sx. Pt states that his sinus congestion has gotten worse over the past 4 days. Denies fever or tooth sensitivity. Denies reproducible pain to palpation of the sinuses. Tried decongestant without improvement.    Problems:  Patient Active Problem List   Diagnosis Date Noted   Upper airway cough syndrome 04/18/2021   Essential hypertension 07/02/2015   Hypertriglyceridemia 01/19/2014   Discoid eczema 03/30/2013   Coronary atherosclerosis of native coronary artery 11/03/2012   Hyperlipidemia with target LDL less than 100 11/03/2012   Routine general medical examination at a health care facility 10/29/2011   COPD GOLD 2 06/20/2009    Allergies: No Known Allergies Medications:  Current Outpatient Medications:    fluticasone (FLONASE) 50 MCG/ACT nasal spray, Place 2 sprays into both nostrils daily., Disp: 16 g, Rfl: 0   montelukast (SINGULAIR) 10 MG tablet, Take 1 tablet (10 mg total) by mouth at bedtime., Disp:  20 tablet, Rfl: 0   predniSONE (DELTASONE) 50 MG tablet, Take 1 tablet (50 mg total) by mouth daily with breakfast., Disp: 6 tablet, Rfl: 0   albuterol (VENTOLIN HFA) 108 (90 Base) MCG/ACT inhaler, INHALE 1 TO 2 PUFFS INTO THE LUNGS EVERY 6 HOURS AS NEEDED FOR WHEEZING OR SHORTNESS OF BREATH, Disp: 8.5 g, Rfl: 0   azelastine (ASTELIN) 0.1 %  nasal spray, Place 2 sprays into both nostrils 2 (two) times daily. Use in each nostril as directed, Disp: 30 mL, Rfl: 1   budesonide-formoterol (SYMBICORT) 80-4.5 MCG/ACT inhaler, Take 2 puffs first thing in am and then another 2 puffs about 12 hours later., Disp: 1 each, Rfl: 12   fenofibrate micronized (LOFIBRA) 134 MG capsule, TAKE 1 CAPSULE BY MOUTH EVERY MORNING BEFORE BREAKFAST, Disp: 90 capsule, Rfl: 3   icosapent Ethyl (VASCEPA) 1 g capsule, TAKE 2 CAPSULES BY MOUTH TWICE DAILY, Disp: 360 capsule, Rfl: 3   pantoprazole (PROTONIX) 40 MG tablet, Take 1 tablet (40 mg total) by mouth daily., Disp: 30 tablet, Rfl: 11   rosuvastatin (CRESTOR) 40 MG tablet, TAKE 1 TABLET BY MOUTH EVERY DAY, Disp: 90 tablet, Rfl: 3   telmisartan (MICARDIS) 40 MG tablet, TAKE 1 TABLET BY MOUTH EVERY DAY, Disp: 90 tablet, Rfl: 3  Observations/Objective: Patient is well-developed, well-nourished in no acute distress.  Resting comfortably at home. Congested/ nasal quality voice Head is normocephalic, atraumatic.  No labored breathing.  Speech is clear and coherent with logical content.  Patient is alert and oriented at baseline.  No visible rhinorrhea  Assessment and Plan: 1. Acute viral sinusitis  Suspect given risk factors, viral sinusitis over bacterial. Supportive measures with PO antihistamines encouraged. Will add flonase. Start neil med sinus rinses. Add pred pack and short burst of montelukast, pt used to take this medication.  Follow Up Instructions: I discussed the assessment and treatment plan with the patient. The patient was provided an opportunity to ask questions and all were answered. The patient agreed with the plan and demonstrated an understanding of the instructions.  A copy of instructions were sent to the patient via MyChart unless otherwise noted below.    The patient was advised to call back or seek an in-person evaluation if the symptoms worsen or if the condition fails to improve as  anticipated.  Time:  I spent 8 minutes with the patient via telehealth technology discussing the above problems/concerns.    [place], PA

## 2022-06-12 NOTE — Patient Instructions (Signed)
Stephen Davila, thank you for joining Chaney Malling, PA for today's virtual visit.  While this provider is not your primary care provider (PCP), if your PCP is located in our provider database this encounter information will be shared with them immediately following your visit.   [facility] account gives you access to today's visit and all your visits, tests, and labs performed at [facility] " click here if you don't have a [facility] account or go to [email or web address]  Consent: (Patient) Stephen Davila provided verbal consent for this virtual visit at the beginning of the encounter.  Current Medications:  Current Outpatient Medications:    fluticasone (FLONASE) 50 MCG/ACT nasal spray, Place 2 sprays into both nostrils daily., Disp: 16 g, Rfl: 0   montelukast (SINGULAIR) 10 MG tablet, Take 1 tablet (10 mg total) by mouth at bedtime., Disp: 20 tablet, Rfl: 0   predniSONE (DELTASONE) 50 MG tablet, Take 1 tablet (50 mg total) by mouth daily with breakfast., Disp: 6 tablet, Rfl: 0   albuterol (VENTOLIN HFA) 108 (90 Base) MCG/ACT inhaler, INHALE 1 TO 2 PUFFS INTO THE LUNGS EVERY 6 HOURS AS NEEDED FOR WHEEZING OR SHORTNESS OF BREATH, Disp: 8.5 g, Rfl: 0   azelastine (ASTELIN) 0.1 % nasal spray, Place 2 sprays into both nostrils 2 (two) times daily. Use in each nostril as directed, Disp: 30 mL, Rfl: 1   budesonide-formoterol (SYMBICORT) 80-4.5 MCG/ACT inhaler, Take 2 puffs first thing in am and then another 2 puffs about 12 hours later., Disp: 1 each, Rfl: 12   fenofibrate micronized (LOFIBRA) 134 MG capsule, TAKE 1 CAPSULE BY MOUTH EVERY MORNING BEFORE BREAKFAST, Disp: 90 capsule, Rfl: 3   icosapent Ethyl (VASCEPA) 1 g capsule, TAKE 2 CAPSULES BY MOUTH TWICE DAILY, Disp: 360 capsule, Rfl: 3   pantoprazole (PROTONIX) 40 MG tablet, Take 1 tablet (40 mg total) by mouth daily., Disp: 30 tablet, Rfl: 11   rosuvastatin (CRESTOR) 40 MG tablet,  TAKE 1 TABLET BY MOUTH EVERY DAY, Disp: 90 tablet, Rfl: 3   telmisartan (MICARDIS) 40 MG tablet, TAKE 1 TABLET BY MOUTH EVERY DAY, Disp: 90 tablet, Rfl: 3   Medications ordered in this encounter:  Meds ordered this encounter  Medications   montelukast (SINGULAIR) 10 MG tablet    Sig: Take 1 tablet (10 mg total) by mouth at bedtime.    Dispense:  20 tablet    Refill:  0    Order Specific Question:   Supervising Provider    Answer:   Chase Picket [id number]   fluticasone (FLONASE) 50 MCG/ACT nasal spray    Sig: Place 2 sprays into both nostrils daily.    Dispense:  16 g    Refill:  0    Order Specific Question:   Supervising Provider    Answer:   Chase Picket [id number]   predniSONE (DELTASONE) 50 MG tablet    Sig: Take 1 tablet (50 mg total) by mouth daily with breakfast.    Dispense:  6 tablet    Refill:  0    Order Specific Question:   Supervising Provider    Answer:   Chase Picket [id number]     *If you need refills on other medications prior to your next appointment, please contact your pharmacy*  Follow-Up: Call back or seek an in-person evaluation if the symptoms worsen or if the condition fails to improve as anticipated.  [facility] [phone]  Other Instructions You  have a viral vs allergic sinus infection.  Start taking prednisone once daily to help clear up the mucous. Use Flonase daily to help with inflammation of the nasal passage. Montelukast can be taken at night, continue zyrtec in the mornings.  It is also recommended that you use nasal saline/ sinus washes to cleans the sinus passages. Milta Deiters Med Sinus rinse is an excellent option. Use the pre-mixed packets with distilled water, warm for 10 seconds in the microwave, and flush nasal passaged.  Hot steam from a shower or vaporizer may also be beneficial to help open up the upper airway. Eucalyptus can be helpful. If any worsening symptoms such as headache, fever, or shortness of  breath, please go to an in person evaluation/ urgent care.    If you have been instructed to have an in-person evaluation today at a local Urgent Care facility, please use the link below. It will take you to a list of all of our available [facility], including address, phone number and hours of operation. Please do not delay care.  [facility]  If you or a family member do not have a primary care provider, use the link below to schedule a visit and establish care. When you choose a [facility] primary care physician or advanced practice provider, you gain a long-term partner in health. Find a Primary Care Provider  Learn more about [facility] in-office and virtual care options: [facility]

## 2022-06-19 ENCOUNTER — Ambulatory Visit: Payer: BC Managed Care – PPO | Admitting: Orthopaedic Surgery

## 2022-09-05 ENCOUNTER — Encounter: Payer: Self-pay | Admitting: Adult Health

## 2022-09-09 MED ORDER — BUDESONIDE-FORMOTEROL FUMARATE 80-4.5 MCG/ACT IN AERO: INHALATION_SPRAY | RESPIRATORY_TRACT | 1 refills | Status: DC

## 2022-09-09 NOTE — Telephone Encounter (Signed)
Pt is aware cory not in office and pt has about 5 days left of medication

## 2022-09-25 ENCOUNTER — Other Ambulatory Visit: Payer: Self-pay | Admitting: *Deleted

## 2022-09-25 DIAGNOSIS — I2510 Atherosclerotic heart disease of native coronary artery without angina pectoris: Secondary | ICD-10-CM

## 2022-09-25 DIAGNOSIS — I10 Essential (primary) hypertension: Secondary | ICD-10-CM

## 2022-09-25 MED ORDER — TELMISARTAN 40 MG PO TABS: ORAL_TABLET | ORAL | 0 refills | Status: DC

## 2022-09-25 MED ORDER — FENOFIBRATE 134 MG PO CAPS: ORAL_CAPSULE | ORAL | 0 refills | Status: DC

## 2022-09-25 MED ORDER — ROSUVASTATIN CALCIUM 40 MG PO TABS: 40.0000 mg | ORAL_TABLET | Freq: Every day | ORAL | 0 refills | Status: DC

## 2022-10-01 ENCOUNTER — Encounter: Payer: Self-pay | Admitting: Adult Health

## 2022-10-01 ENCOUNTER — Ambulatory Visit (INDEPENDENT_AMBULATORY_CARE_PROVIDER_SITE_OTHER): Payer: BC Managed Care – PPO | Admitting: Adult Health

## 2022-10-01 VITALS — BP 110/60 | HR 71 | Temp 98.0°F | Ht 70.0 in | Wt 158.0 lb

## 2022-10-01 DIAGNOSIS — I10 Essential (primary) hypertension: Secondary | ICD-10-CM

## 2022-10-01 DIAGNOSIS — Z125 Encounter for screening for malignant neoplasm of prostate: Secondary | ICD-10-CM

## 2022-10-01 DIAGNOSIS — I2510 Atherosclerotic heart disease of native coronary artery without angina pectoris: Secondary | ICD-10-CM | POA: Diagnosis not present

## 2022-10-01 DIAGNOSIS — Z0000 Encounter for general adult medical examination without abnormal findings: Secondary | ICD-10-CM

## 2022-10-01 DIAGNOSIS — J449 Chronic obstructive pulmonary disease, unspecified: Secondary | ICD-10-CM | POA: Diagnosis not present

## 2022-10-01 DIAGNOSIS — Z87891 Personal history of nicotine dependence: Secondary | ICD-10-CM

## 2022-10-01 DIAGNOSIS — L309 Dermatitis, unspecified: Secondary | ICD-10-CM

## 2022-10-01 DIAGNOSIS — Z23 Encounter for immunization: Secondary | ICD-10-CM

## 2022-10-01 DIAGNOSIS — E785 Hyperlipidemia, unspecified: Secondary | ICD-10-CM | POA: Diagnosis not present

## 2022-10-01 LAB — COMPREHENSIVE METABOLIC PANEL
ALT: 38 U/L (ref 0–53)
AST: 56 U/L — ABNORMAL HIGH (ref 0–37)
Albumin: 3.8 g/dL (ref 3.5–5.2)
Alkaline Phosphatase: 65 U/L (ref 39–117)
BUN: 17 mg/dL (ref 6–23)
CO2: 25 mEq/L (ref 19–32)
Calcium: 8.7 mg/dL (ref 8.4–10.5)
Chloride: 105 mEq/L (ref 96–112)
Creatinine, Ser: 0.98 mg/dL (ref 0.40–1.50)
GFR: 81.71 mL/min (ref >60.00)
Glucose, Bld: 101 mg/dL — ABNORMAL HIGH (ref 70–99)
Potassium: 4.3 mEq/L (ref 3.5–5.1)
Sodium: 138 mEq/L (ref 135–145)
Total Bilirubin: 0.6 mg/dL (ref 0.2–1.2)
Total Protein: 6.4 g/dL (ref 6.0–8.3)

## 2022-10-01 LAB — CBC
HCT: 46.2 % (ref 39.0–52.0)
Hemoglobin: 15.1 g/dL (ref 13.0–17.0)
MCHC: 32.8 g/dL (ref 30.0–36.0)
MCV: 99.1 fl (ref 78.0–100.0)
Platelets: 264 10*3/uL (ref 150.0–400.0)
RBC: 4.66 Mil/uL (ref 4.22–5.81)
RDW: 13.2 % (ref 11.5–15.5)
WBC: 9.1 10*3/uL (ref 4.0–10.5)

## 2022-10-01 LAB — LIPID PANEL
Cholesterol: 164 mg/dL (ref 0–200)
HDL: 67.4 mg/dL (ref >39.00)
LDL Cholesterol: 66 mg/dL (ref 0–99)
NonHDL: 96.89
Total CHOL/HDL Ratio: 2
Triglycerides: 155 mg/dL — ABNORMAL HIGH (ref 0.0–149.0)
VLDL: 31 mg/dL (ref 0.0–40.0)

## 2022-10-01 LAB — IBC + FERRITIN
Ferritin: 702.6 ng/mL — ABNORMAL HIGH (ref 22.0–322.0)
Iron: 169 ug/dL — ABNORMAL HIGH (ref 42–165)
Saturation Ratios: 57.2 % — ABNORMAL HIGH (ref 20.0–50.0)
TIBC: 295.4 ug/dL (ref 250.0–450.0)
Transferrin: 211 mg/dL — ABNORMAL LOW (ref 212.0–360.0)

## 2022-10-01 LAB — TSH: TSH: 1.45 u[IU]/mL (ref 0.35–5.50)

## 2022-10-01 LAB — PSA: PSA: 0.78 ng/mL (ref 0.10–4.00)

## 2022-10-01 MED ORDER — CLOBETASOL PROPIONATE 0.05 % EX CREA
1.0000 | TOPICAL_CREAM | Freq: Two times a day (BID) | CUTANEOUS | 2 refills | Status: AC
Start: 2022-10-01 — End: ?

## 2022-10-01 NOTE — Patient Instructions (Signed)
It was great seeing you today   We will follow up with you regarding your lab work   Please let me know if you need anything   Someone will call you to schedule your lung cancer screening
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
## 2022-10-28 ENCOUNTER — Encounter: Payer: Self-pay | Admitting: Adult Health

## 2022-10-28 ENCOUNTER — Ambulatory Visit: Payer: BC Managed Care – PPO | Attending: Adult Health | Admitting: Adult Health

## 2022-10-28 VITALS — BP 122/72 | Ht 70.0 in | Wt 161.0 lb

## 2022-10-28 DIAGNOSIS — I2510 Atherosclerotic heart disease of native coronary artery without angina pectoris: Secondary | ICD-10-CM | POA: Diagnosis not present

## 2022-10-28 DIAGNOSIS — I10 Essential (primary) hypertension: Secondary | ICD-10-CM

## 2022-10-28 MED ORDER — FENOFIBRATE 134 MG PO CAPS
ORAL_CAPSULE | ORAL | 3 refills | Status: DC
Start: 2022-10-28 — End: 2023-11-04

## 2022-10-28 MED ORDER — ROSUVASTATIN CALCIUM 40 MG PO TABS
40.0000 mg | ORAL_TABLET | Freq: Every day | ORAL | 3 refills | Status: DC
Start: 2022-10-28 — End: 2023-11-04

## 2022-10-28 MED ORDER — TELMISARTAN 40 MG PO TABS
ORAL_TABLET | ORAL | 3 refills | Status: DC
Start: 2022-10-28 — End: 2023-11-04

## 2022-10-28 NOTE — Patient Instructions (Signed)
Medication Instructions:  Your physician recommends that you continue on your current medications as directed. Please refer to the Current Medication list given to you today.  *If you need a refill on your cardiac medications before your next appointment, please call your pharmacy*   Lab Work: NONE ordered at this time of appointment     Testing/Procedures: NONE ordered at this time of appointment     Follow-Up: At [facility], you and your health needs are our priority.  As part of our continuing mission to provide you with exceptional heart care, we have created designated Provider Care Teams.  These Care Teams include your primary Cardiologist (physician) and Advanced Practice Providers (APPs -  Physician Assistants and Nurse Practitioners) who all work together to provide you with the care you need, when you need it.  We recommend signing up for the patient portal called "MyChart".  Sign up information is provided on this After Visit Summary.  MyChart is used to connect with patients for Virtual Visits (Telemedicine).  Patients are able to view lab/test results, encounter notes, upcoming appointments, etc.  Non-urgent messages can be sent to your provider as well.   To learn more about what you can do with MyChart, go to [email or web address].    Your next appointment:   1 year(s)  Provider:   Thurmon Fair, MD

## 2022-11-01 ENCOUNTER — Other Ambulatory Visit: Payer: Self-pay | Admitting: Cardiovascular Disease

## 2022-11-01 DIAGNOSIS — I10 Essential (primary) hypertension: Secondary | ICD-10-CM

## 2022-11-01 DIAGNOSIS — I2510 Atherosclerotic heart disease of native coronary artery without angina pectoris: Secondary | ICD-10-CM

## 2022-12-01 ENCOUNTER — Other Ambulatory Visit: Payer: Self-pay

## 2022-12-01 MED ORDER — BUDESONIDE-FORMOTEROL FUMARATE 80-4.5 MCG/ACT IN AERO: INHALATION_SPRAY | RESPIRATORY_TRACT | 1 refills | Status: DC

## 2023-02-22 ENCOUNTER — Other Ambulatory Visit: Payer: Self-pay | Admitting: Adult Health

## 2023-04-06 ENCOUNTER — Telehealth: Payer: BC Managed Care – PPO

## 2023-04-06 DIAGNOSIS — R6889 Other general symptoms and signs: Secondary | ICD-10-CM | POA: Diagnosis not present

## 2023-04-06 MED ORDER — OSELTAMIVIR PHOSPHATE 75 MG PO CAPS: 75.0000 mg | ORAL_CAPSULE | Freq: Two times a day (BID) | ORAL | 0 refills | Status: DC

## 2023-04-06 NOTE — Patient Instructions (Signed)
Stephen Davila, thank you for joining Margaretann Loveless, PA-C for today's virtual visit.  While this provider is not your primary care provider (PCP), if your PCP is located in our provider database this encounter information will be shared with them immediately following your visit.   A [facility] MyChart account gives you access to today's visit and all your visits, tests, and labs performed at [facility] " click here if you don't have a [facility] MyChart account or go to [email or web address]  Consent: (Patient) Abhimanyu Cruces Lapierre provided verbal consent for this virtual visit at the beginning of the encounter.  Current Medications:  Current Outpatient Medications:    oseltamivir (TAMIFLU) 75 MG capsule, Take 1 capsule (75 mg total) by mouth 2 (two) times daily., Disp: 10 capsule, Rfl: 0   albuterol (VENTOLIN HFA) 108 (90 Base) MCG/ACT inhaler, INHALE 1 TO 2 PUFFS INTO THE LUNGS EVERY 6 HOURS AS NEEDED FOR WHEEZING OR SHORTNESS OF BREATH, Disp: 8.5 g, Rfl: 0   budesonide-formoterol (SYMBICORT) 80-4.5 MCG/ACT inhaler, INHALE 2 PUFFS FIRST THING EVERY MORNING, AND 2 PUFFS ABOUT 12 HOURS LATER, Disp: 10.2 g, Rfl: 0   clobetasol cream (TEMOVATE) 0.05 %, Apply 1 Application topically 2 (two) times daily., Disp: 30 g, Rfl: 2   fenofibrate micronized (LOFIBRA) 134 MG capsule, TAKE 1 CAPSULE BY MOUTH EVERY MORNING BEFORE BREAKFAST, Disp: 90 capsule, Rfl: 3   fluticasone (FLONASE) 50 MCG/ACT nasal spray, Place 2 sprays into both nostrils daily., Disp: 16 g, Rfl: 0   rosuvastatin (CRESTOR) 40 MG tablet, Take 1 tablet (40 mg total) by mouth daily., Disp: 90 tablet, Rfl: 3   telmisartan (MICARDIS) 40 MG tablet, TAKE 1 TABLET BY MOUTH EVERY DAY, Disp: 90 tablet, Rfl: 3   Medications ordered in this encounter:  Meds ordered this encounter  Medications   oseltamivir (TAMIFLU) 75 MG capsule    Sig: Take 1 capsule (75 mg total) by mouth 2 (two) times daily.     Dispense:  10 capsule    Refill:  0    Supervising Provider:   Merrilee Jansky [[id number]]     *If you need refills on other medications prior to your next appointment, please contact your pharmacy*  Follow-Up: Call back or seek an in-person evaluation if the symptoms worsen or if the condition fails to improve as anticipated.  [facility] [phone]  Other Instructions  Influenza, Adult Influenza is also called the flu. It's an infection that affects your respiratory tract. This includes your nose, throat, windpipe, and lungs. The flu is contagious. This means it spreads easily from person to person. It causes symptoms that are like a cold. It can also cause a high fever and body aches. What are the causes? The flu is caused by the influenza virus. You can get it by: Breathing in droplets that are in the air after an infected person coughs or sneezes. Touching something that has the virus on it and then touching your mouth, nose, or eyes. What increases the risk? You may be more likely to get the flu if: You don't wash your hands often. You're near a lot of people during cold and flu season. You touch your mouth, eyes, or nose without washing your hands first. You don't get a flu shot each year. You may also be more at risk for the flu and serious problems, such as a lung infection called pneumonia, if: You're older than 65. You're pregnant. Your immune system is  weak. Your immune system is your body's defense system. You have a long-term, or chronic, condition, such as: Heart, kidney, or lung disease. Diabetes. A liver disorder. Asthma. You're very overweight. You have anemia. This is when you don't have enough red blood cells in your body. What are the signs or symptoms? Flu symptoms often start all of a sudden. They may last 4-14 days and include: Fever and chills. Headaches, body aches, or muscle aches. Sore throat. Cough. Runny or stuffy  nose. Discomfort in your chest. Not wanting to eat as much as normal. Feeling weak or tired. Feeling dizzy. Nausea or vomiting. How is this diagnosed? The flu may be diagnosed based on your symptoms and medical history. You may also have a physical exam. A swab may be taken from your nose or throat and tested for the virus. How is this treated? If the flu is found early, you can be treated with antiviral medicine. This may be given to you by mouth or through an IV. It can help you feel less sick and get better faster. Taking care of yourself at home can also help your symptoms get better. Your health care provider may tell you to: Take over-the-counter medicines. Drink lots of fluids. The flu often goes away on its own. If you have very bad symptoms or problems caused by the flu, you may need to be treated in a hospital. Follow these instructions at home: Activity Rest as needed. Get lots of sleep. Stay home from work or school as told by your provider. Leave home only to go see your provider. Do not leave home for other reasons until you don't have a fever for 24 hours without taking medicine. Eating and drinking Take an oral rehydration solution (ORS). This is a drink that is sold at pharmacies and stores. Drink enough fluid to keep your pee pale yellow. Try to drink small amounts of clear fluids. These include water, ice chips, fruit juice mixed with water, and low-calorie sports drinks. Try to eat bland foods that are easy to digest. These include bananas, applesauce, rice, lean meats, toast, and crackers. Avoid drinks that have a lot of sugar or caffeine in them. These include energy drinks, regular sports drinks, and soda. Do not drink alcohol. Do not eat spicy or fatty foods. General instructions     Take your medicines only as told by your provider. Use a cool mist humidifier to add moisture to the air in your home. This can make it easier for you to breathe. You should also  clean the humidifier every day. To do so: Empty the water. Pour clean water in. Cover your mouth and nose when you cough or sneeze. Wash your hands with soap and water often and for at least 20 seconds. It's extra important to do so after you cough or sneeze. If you can't use soap and water, use hand sanitizer. How is this prevented?  Get a flu shot every year. Ask your provider when you should get your flu shot. Stay away from people who are sick during fall and winter. Fall and winter are cold and flu season. Contact a health care provider if: You get new symptoms. You have chest pain. You have watery poop, also called diarrhea. You have a fever. Your cough gets worse. You start to have more mucus. You feel like you may vomit, or you vomit. Get help right away if: You become short of breath or have trouble breathing. Your skin or nails turn  blue. You have very bad pain or stiffness in your neck. You get a sudden headache or pain in your face or ear. You vomit each time you eat or drink. These symptoms may be an emergency. Call 911 right away. Do not wait to see if the symptoms will go away. Do not drive yourself to the hospital. This information is not intended to replace advice given to you by your health care provider. Make sure you discuss any questions you have with your health care provider. Document Revised: 11/27/2022 Document Reviewed: 04/03/2022 Elsevier Patient Education  2024 Elsevier Inc.   If you have been instructed to have an in-person evaluation today at a local Urgent Care facility, please use the link below. It will take you to a list of all of our available [facility], including address, phone number and hours of operation. Please do not delay care.  [facility]  If you or a family member do not have a primary care provider, use the link below to schedule a visit and establish care. When you choose a [facility] primary care physician  or advanced practice provider, you gain a long-term partner in health. Find a Primary Care Provider  Learn more about [facility]'s in-office and virtual care options: [facility] - Get Care Now

## 2023-04-06 NOTE — Progress Notes (Signed)
Virtual Visit Consent   Stephen Davila, you are scheduled for a virtual visit with a [facility] provider today. Just as with appointments in the office, your consent must be obtained to participate. Your consent will be active for this visit and any virtual visit you may have with one of our providers in the next 365 days. If you have a MyChart account, a copy of this consent can be sent to you electronically.  As this is a virtual visit, video technology does not allow for your provider to perform a traditional examination. This may limit your provider's ability to fully assess your condition. If your provider identifies any concerns that need to be evaluated in person or the need to arrange testing (such as labs, EKG, etc.), we will make arrangements to do so. Although advances in technology are sophisticated, we cannot ensure that it will always work on either your end or our end. If the connection with a video visit is poor, the visit may have to be switched to a telephone visit. With either a video or telephone visit, we are not always able to ensure that we have a secure connection.  By engaging in this virtual visit, you consent to the provision of healthcare and authorize for your insurance to be billed (if applicable) for the services provided during this visit. Depending on your insurance coverage, you may receive a charge related to this service.  I need to obtain your verbal consent now. Are you willing to proceed with your visit today? Stephen Davila has provided verbal consent on 04/06/2023 for a virtual visit (video or telephone). Margaretann Loveless, PA-C  Date: 04/06/2023 10:40 AM  Virtual Visit via Video Note   I, Margaretann Loveless, connected with  Stephen Davila  ([id number], 1960/07/06) on 04/06/23 at 10:30 AM EST by a video-enabled telemedicine application and verified that I am speaking with the correct person using two  identifiers.  Location: Patient: Virtual Visit Location Patient: Home Provider: Virtual Visit Location Provider: Home Office   I discussed the limitations of evaluation and management by telemedicine and the availability of in person appointments. The patient expressed understanding and agreed to proceed.    History of Present Illness: Ariz Terrones is a 63 y.o. who identifies as a male who was assigned male at birth, and is being seen today for flu-like illness.  HPI: URI  This is a new problem. The current episode started yesterday. The problem has been gradually worsening. There has been no fever. Associated symptoms include chest pain (tightness), congestion, coughing, headaches, nausea (mild), rhinorrhea and a sore throat. Pertinent negatives include no diarrhea, ear pain, plugged ear sensation, sinus pain, vomiting or wheezing. Associated symptoms comments: Myalgias, chills. He has tried inhaler use (Symbicort, alka seltzer cold and cough) for the symptoms. The treatment provided no relief.   Tested negative for Covid 19 on at home test yesterday Has positive exposure to influenza A  Problems:  Patient Active Problem List   Diagnosis Date Noted   Upper airway cough syndrome 04/18/2021   Essential hypertension 07/02/2015   Hypertriglyceridemia 01/19/2014   Discoid eczema 03/30/2013   Coronary atherosclerosis of native coronary artery 11/03/2012   Hyperlipidemia with target LDL less than 100 11/03/2012   Routine general medical examination at a health care facility 10/29/2011   COPD GOLD 2 06/20/2009    Allergies: No Known Allergies Medications:  Current Outpatient Medications:    oseltamivir (TAMIFLU) 75 MG capsule, Take 1 capsule (  75 mg total) by mouth 2 (two) times daily., Disp: 10 capsule, Rfl: 0   albuterol (VENTOLIN HFA) 108 (90 Base) MCG/ACT inhaler, INHALE 1 TO 2 PUFFS INTO THE LUNGS EVERY 6 HOURS AS NEEDED FOR WHEEZING OR SHORTNESS OF BREATH, Disp: 8.5 g, Rfl:  0   budesonide-formoterol (SYMBICORT) 80-4.5 MCG/ACT inhaler, INHALE 2 PUFFS FIRST THING EVERY MORNING, AND 2 PUFFS ABOUT 12 HOURS LATER, Disp: 10.2 g, Rfl: 0   clobetasol cream (TEMOVATE) 0.05 %, Apply 1 Application topically 2 (two) times daily., Disp: 30 g, Rfl: 2   fenofibrate micronized (LOFIBRA) 134 MG capsule, TAKE 1 CAPSULE BY MOUTH EVERY MORNING BEFORE BREAKFAST, Disp: 90 capsule, Rfl: 3   fluticasone (FLONASE) 50 MCG/ACT nasal spray, Place 2 sprays into both nostrils daily., Disp: 16 g, Rfl: 0   rosuvastatin (CRESTOR) 40 MG tablet, Take 1 tablet (40 mg total) by mouth daily., Disp: 90 tablet, Rfl: 3   telmisartan (MICARDIS) 40 MG tablet, TAKE 1 TABLET BY MOUTH EVERY DAY, Disp: 90 tablet, Rfl: 3  Observations/Objective: Patient is well-developed, well-nourished in no acute distress.  Resting comfortably at home.  Head is normocephalic, atraumatic.  No labored breathing.  Speech is clear and coherent with logical content.  Patient is alert and oriented at baseline.    Assessment and Plan: 1. Flu-like symptoms (Primary) - oseltamivir (TAMIFLU) 75 MG capsule; Take 1 capsule (75 mg total) by mouth 2 (two) times daily.  Dispense: 10 capsule; Refill: 0  - Suspect influenza due to symptoms and positive exposure and negative at home Covid testing - Tamiflu prescribed - Continue OTC medication of choice for symptomatic management - Continue Symbicort daily, and use albuterol as needed - Push fluids - Rest - Steam and humidifier as needed - Seek in person evaluation if symptoms worsen or fail to improve   Follow Up Instructions: I discussed the assessment and treatment plan with the patient. The patient was provided an opportunity to ask questions and all were answered. The patient agreed with the plan and demonstrated an understanding of the instructions.  A copy of instructions were sent to the patient via MyChart unless otherwise noted below.    The patient was advised to call  back or seek an in-person evaluation if the symptoms worsen or if the condition fails to improve as anticipated.    Margaretann Loveless, PA-C

## 2023-06-04 ENCOUNTER — Other Ambulatory Visit: Payer: Self-pay | Admitting: Adult Health

## 2023-06-15 ENCOUNTER — Encounter: Payer: Self-pay | Admitting: Adult Health

## 2023-07-01 ENCOUNTER — Encounter: Payer: Self-pay | Admitting: Orthopaedic Surgery

## 2023-07-01 ENCOUNTER — Other Ambulatory Visit (INDEPENDENT_AMBULATORY_CARE_PROVIDER_SITE_OTHER): Payer: Self-pay

## 2023-07-01 ENCOUNTER — Ambulatory Visit: Admitting: Orthopaedic Surgery

## 2023-07-01 DIAGNOSIS — G8929 Other chronic pain: Secondary | ICD-10-CM

## 2023-07-01 DIAGNOSIS — M25512 Pain in left shoulder: Secondary | ICD-10-CM

## 2023-07-01 MED ORDER — METHYLPREDNISOLONE ACETATE 40 MG/ML IJ SUSP
40.0000 mg | INTRAMUSCULAR | Status: AC | PRN
Start: 2023-07-01 — End: 2023-07-01
  Administered 2023-07-01: 40 mg via INTRA_ARTICULAR

## 2023-07-01 MED ORDER — LIDOCAINE HCL 1 % IJ SOLN
3.0000 mL | INTRAMUSCULAR | Status: AC | PRN
  Administered 2023-07-01: 3 mL

## 2023-07-01 NOTE — Progress Notes (Signed)
 The patient is an active 63 year old gentleman that we have seen in the past.  We saw him back in 2017 for his left shoulder and placed a steroid injection in his left shoulder and its never bothered him since then until recently.  He has had pain off and on for years but no injury.  We have even injected his his right shoulder in the past.  He is not a diabetic.  He denies any acute changes in medical status.  Examination of his left shoulder shows it moves smoothly and fluidly.  He does have positive Neer and Hawkins signs but there is no deficit in the rotator cuff and his range of motion is full.  3 views left shoulder show some mild to moderate arthritis of the [facility] joint but otherwise a well located shoulder.  There is adequate space of the subacromial outlet and the humeral head is not high riding.  I did recommend a steroid injection in his left shoulder subacromial outlet which she hoped to have today as well given the fact that this has helped significantly past.  He tolerated it very well.  If his pain continues and this does not work, our next step would be obtaining an MRI of his left shoulder to assess the rotator cuff.  He does not need physical therapy since he has good strength and his range of motion is full.    Procedure Note  Patient: Stephen Davila             Date of Birth: Dec 31, 1960           MRN: [id number]             Visit Date: 07/01/2023  Procedures: Visit Diagnoses:  1. Chronic left shoulder pain     Large Joint Inj: L subacromial bursa on 07/01/2023 3:04 PM Indications: pain and diagnostic evaluation Details: 22 G 1.5 in needle  Arthrogram: No  Medications: 3 mL lidocaine  1 %; 40 mg methylPREDNISolone  acetate 40 MG/ML Outcome: tolerated well, no immediate complications Procedure, treatment alternatives, risks and benefits explained, specific risks discussed. Consent was given by the patient. Immediately prior to procedure a time out was called to  verify the correct patient, procedure, equipment, support staff and site/side marked as required. Patient was prepped and draped in the usual sterile fashion.

## 2023-08-09 ENCOUNTER — Encounter: Payer: Self-pay | Admitting: Adult Health

## 2023-08-09 HISTORY — PX: CATARACT EXTRACTION, BILATERAL: SHX1313

## 2023-08-11 ENCOUNTER — Telehealth: Payer: Self-pay

## 2023-08-11 MED ORDER — BUDESONIDE-FORMOTEROL FUMARATE 80-4.5 MCG/ACT IN AERO: INHALATION_SPRAY | RESPIRATORY_TRACT | 0 refills | Status: DC

## 2023-08-11 NOTE — Telephone Encounter (Signed)
This has been taking care of.

## 2023-08-11 NOTE — Addendum Note (Signed)
 Addended by: Twyla Galeazzi R on: 08/11/2023 04:51 PM   Modules accepted: Orders

## 2023-08-11 NOTE — Addendum Note (Signed)
 Addended by: Twyla Galeazzi R on: 08/11/2023 04:19 PM   Modules accepted: Orders

## 2023-08-11 NOTE — Telephone Encounter (Signed)
 Copied from CRM [id number]. Topic: Clinical - Prescription Issue >> Aug 11, 2023  3:25 PM [place] E wrote: Reason for CRM: Patient called in regarding his budesonide -formoterol  (SYMBICORT ) 80-4.5 MCG/ACT inhaler. Patient needed [facility] medication [facility] to [facility] [address] and it accidentally got sent to the [facility] on [address]. Patient received a message that this medication was ready for pick up at [facility] and then they stated they don't have it. Patient questioning [facility]. Callback number for patient is [phone].

## 2023-08-28 DIAGNOSIS — H2513 Age-related nuclear cataract, bilateral: Secondary | ICD-10-CM | POA: Diagnosis not present

## 2023-09-08 ENCOUNTER — Other Ambulatory Visit: Payer: Self-pay | Admitting: Adult Health

## 2023-09-16 DIAGNOSIS — H2512 Age-related nuclear cataract, left eye: Secondary | ICD-10-CM | POA: Diagnosis not present

## 2023-09-26 ENCOUNTER — Encounter: Payer: Self-pay | Admitting: Orthopaedic Surgery

## 2023-09-30 DIAGNOSIS — H2511 Age-related nuclear cataract, right eye: Secondary | ICD-10-CM | POA: Diagnosis not present

## 2023-10-02 ENCOUNTER — Encounter: Admitting: Adult Health

## 2023-10-07 ENCOUNTER — Ambulatory Visit (INDEPENDENT_AMBULATORY_CARE_PROVIDER_SITE_OTHER): Payer: Self-pay | Admitting: Adult Health

## 2023-10-07 ENCOUNTER — Encounter: Payer: Self-pay | Admitting: Adult Health

## 2023-10-07 ENCOUNTER — Ambulatory Visit: Payer: Self-pay | Admitting: Adult Health

## 2023-10-07 VITALS — BP 100/60 | HR 69 | Temp 98.2°F | Ht 70.0 in | Wt 156.0 lb

## 2023-10-07 DIAGNOSIS — Z125 Encounter for screening for malignant neoplasm of prostate: Secondary | ICD-10-CM

## 2023-10-07 DIAGNOSIS — E785 Hyperlipidemia, unspecified: Secondary | ICD-10-CM | POA: Diagnosis not present

## 2023-10-07 DIAGNOSIS — I10 Essential (primary) hypertension: Secondary | ICD-10-CM | POA: Diagnosis not present

## 2023-10-07 DIAGNOSIS — Z0000 Encounter for general adult medical examination without abnormal findings: Secondary | ICD-10-CM

## 2023-10-07 DIAGNOSIS — J449 Chronic obstructive pulmonary disease, unspecified: Secondary | ICD-10-CM | POA: Diagnosis not present

## 2023-10-07 DIAGNOSIS — Z8551 Personal history of malignant neoplasm of bladder: Secondary | ICD-10-CM

## 2023-10-07 DIAGNOSIS — Z23 Encounter for immunization: Secondary | ICD-10-CM

## 2023-10-07 DIAGNOSIS — L309 Dermatitis, unspecified: Secondary | ICD-10-CM | POA: Diagnosis not present

## 2023-10-07 LAB — COMPREHENSIVE METABOLIC PANEL WITH GFR
ALT: 30 U/L (ref 0–53)
AST: 33 U/L (ref 0–37)
Albumin: 3.9 g/dL (ref 3.5–5.2)
Alkaline Phosphatase: 57 U/L (ref 39–117)
BUN: 19 mg/dL (ref 6–23)
CO2: 29 meq/L (ref 19–32)
Calcium: 8.7 mg/dL (ref 8.4–10.5)
Chloride: 103 meq/L (ref 96–112)
Creatinine, Ser: 0.89 mg/dL (ref 0.40–1.50)
GFR: 90.16 mL/min (ref >60.00)
Glucose, Bld: 95 mg/dL (ref 70–99)
Potassium: 4.5 meq/L (ref 3.5–5.1)
Sodium: 139 meq/L (ref 135–145)
Total Bilirubin: 0.5 mg/dL (ref 0.2–1.2)
Total Protein: 6.3 g/dL (ref 6.0–8.3)

## 2023-10-07 LAB — CBC
HCT: 43.4 % (ref 39.0–52.0)
Hemoglobin: 14.7 g/dL (ref 13.0–17.0)
MCHC: 33.9 g/dL (ref 30.0–36.0)
MCV: 98.4 fl (ref 78.0–100.0)
Platelets: 238 K/uL (ref 150.0–400.0)
RBC: 4.41 Mil/uL (ref 4.22–5.81)
RDW: 12.4 % (ref 11.5–15.5)
WBC: 7.4 K/uL (ref 4.0–10.5)

## 2023-10-07 LAB — PSA: PSA: 1.1 ng/mL (ref 0.10–4.00)

## 2023-10-07 LAB — LIPID PANEL
Cholesterol: 156 mg/dL (ref 0–200)
HDL: 57.2 mg/dL (ref >39.00)
LDL Cholesterol: 61 mg/dL (ref 0–99)
NonHDL: 98.67
Total CHOL/HDL Ratio: 3
Triglycerides: 188 mg/dL — ABNORMAL HIGH (ref 0.0–149.0)
VLDL: 37.6 mg/dL (ref 0.0–40.0)

## 2023-10-07 LAB — TSH: TSH: 1.25 u[IU]/mL (ref 0.35–5.50)

## 2023-10-07 MED ORDER — BUDESONIDE-FORMOTEROL FUMARATE 80-4.5 MCG/ACT IN AERO: INHALATION_SPRAY | RESPIRATORY_TRACT | 3 refills | Status: DC

## 2023-10-07 NOTE — Patient Instructions (Addendum)
 It was great seeing you today   We will follow up with you regarding your lab work   Please let me know if you need anything    Please call [facility]  Address: [address], [place], [place] [address] Phone: [phone]
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
## 2023-10-08 ENCOUNTER — Ambulatory Visit: Admitting: Physician Assistant

## 2023-10-08 DIAGNOSIS — G8929 Other chronic pain: Secondary | ICD-10-CM | POA: Diagnosis not present

## 2023-10-08 DIAGNOSIS — M25512 Pain in left shoulder: Secondary | ICD-10-CM

## 2023-10-08 MED ORDER — METHYLPREDNISOLONE ACETATE 40 MG/ML IJ SUSP
40.0000 mg | INTRAMUSCULAR | Status: AC | PRN
  Administered 2023-10-08: 40 mg via INTRA_ARTICULAR

## 2023-10-08 MED ORDER — LIDOCAINE HCL 1 % IJ SOLN
3.0000 mL | INTRAMUSCULAR | Status: AC | PRN
  Administered 2023-10-08: 3 mL

## 2023-10-08 NOTE — Progress Notes (Signed)
   Procedure Note  Patient: Stephen Davila             Date of Birth: 05/14/1960           MRN: [id number]             Visit Date: 10/08/2023  HPI: Stephen Davila comes in today due to left shoulder pain.  He was last seen by Dr. Vernetta 07/01/23 he had an injection left shoulder.  This injection was beneficial until about 3 to 4 weeks ago when he had increased pain.  He has had no injury to the shoulder.  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  He is having pain with pushing and lifting in the deltoid region.  Constant soreness.  Pain with overhead activity and when turning over the bed.  He denies any numbness tingling down the arm or neck pain.  Review of systems: See HPI. Denies any fevers chills  Physical exam: General Well-developed well-nourished male no acute distress mood affect appropriate.  Psych alert and oriented x 3. Bilateral shoulders: 5 out of 5 strength with external and internal rotation against resistance empty can test is negative.  Impingement testing is negative bilaterally.  Liftoff test negative bilaterally.  Nontender of the AC joints bilaterally.    Procedures: Visit Diagnoses:  1. Chronic left shoulder pain     Large Joint Inj on 10/08/2023 4:55 PM Indications: pain Details: 22 G 1.5 in needle, superior approach  Arthrogram: No  Medications: 3 mL lidocaine  1 %; 40 mg methylPREDNISolone  acetate 40 MG/ML Outcome: tolerated well, no immediate complications Procedure, treatment alternatives, risks and benefits explained, specific risks discussed. Consent was given by the patient. Immediately prior to procedure a time out was called to verify the correct patient, procedure, equipment, support staff and site/side marked as required. Patient was prepped and draped in the usual sterile fashion.     Plan: He is given shoulder exercises and Thera-Band.  He will follow-up with us  as needed.  Questions were encouraged and answered at length.

## 2023-10-11 ENCOUNTER — Encounter: Payer: Self-pay | Admitting: Adult Health

## 2023-10-11 DIAGNOSIS — J449 Chronic obstructive pulmonary disease, unspecified: Secondary | ICD-10-CM

## 2023-10-13 MED ORDER — BUDESONIDE-FORMOTEROL FUMARATE 80-4.5 MCG/ACT IN AERO: INHALATION_SPRAY | RESPIRATORY_TRACT | 3 refills | Status: DC

## 2023-10-20 ENCOUNTER — Encounter: Payer: Self-pay | Admitting: Adult Health

## 2023-10-20 DIAGNOSIS — J449 Chronic obstructive pulmonary disease, unspecified: Secondary | ICD-10-CM

## 2023-10-20 MED ORDER — BUDESONIDE-FORMOTEROL FUMARATE 80-4.5 MCG/ACT IN AERO: INHALATION_SPRAY | RESPIRATORY_TRACT | 3 refills | Status: AC

## 2023-10-20 NOTE — Addendum Note (Signed)
 Addended by: VICCI LEADER R on: 10/20/2023 05:19 PM   Modules accepted: Orders

## 2023-11-03 ENCOUNTER — Emergency Department (HOSPITAL_BASED_OUTPATIENT_CLINIC_OR_DEPARTMENT_OTHER)
Admission: EM | Admit: 2023-11-03 | Discharge: 2023-11-03 | Disposition: A | Discharge status: Home / Self Care | Attending: Emergency Medicine | Admitting: Emergency Medicine

## 2023-11-03 ENCOUNTER — Other Ambulatory Visit: Payer: Self-pay

## 2023-11-03 ENCOUNTER — Emergency Department (HOSPITAL_BASED_OUTPATIENT_CLINIC_OR_DEPARTMENT_OTHER): Admitting: Radiology

## 2023-11-03 ENCOUNTER — Encounter (HOSPITAL_BASED_OUTPATIENT_CLINIC_OR_DEPARTMENT_OTHER): Payer: Self-pay

## 2023-11-03 DIAGNOSIS — E119 Type 2 diabetes mellitus without complications: Secondary | ICD-10-CM | POA: Insufficient documentation

## 2023-11-03 DIAGNOSIS — Z87891 Personal history of nicotine dependence: Secondary | ICD-10-CM | POA: Diagnosis not present

## 2023-11-03 DIAGNOSIS — I10 Essential (primary) hypertension: Secondary | ICD-10-CM | POA: Insufficient documentation

## 2023-11-03 DIAGNOSIS — J449 Chronic obstructive pulmonary disease, unspecified: Secondary | ICD-10-CM | POA: Diagnosis not present

## 2023-11-03 DIAGNOSIS — R072 Precordial pain: Secondary | ICD-10-CM

## 2023-11-03 DIAGNOSIS — I700 Atherosclerosis of aorta: Secondary | ICD-10-CM | POA: Diagnosis not present

## 2023-11-03 DIAGNOSIS — R002 Palpitations: Secondary | ICD-10-CM | POA: Insufficient documentation

## 2023-11-03 DIAGNOSIS — R0602 Shortness of breath: Secondary | ICD-10-CM | POA: Insufficient documentation

## 2023-11-03 DIAGNOSIS — Z981 Arthrodesis status: Secondary | ICD-10-CM | POA: Diagnosis not present

## 2023-11-03 LAB — CBC WITH DIFFERENTIAL/PLATELET
Abs Immature Granulocytes: 0.06 K/uL (ref 0.00–0.07)
Basophils Absolute: 0.1 K/uL (ref 0.0–0.1)
Basophils Relative: 1 %
Eosinophils Absolute: 0.2 K/uL (ref 0.0–0.5)
Eosinophils Relative: 2 %
HCT: 42 % (ref 39.0–52.0)
Hemoglobin: 15.1 g/dL (ref 13.0–17.0)
Immature Granulocytes: 1 %
Lymphocytes Relative: 21 %
Lymphs Abs: 2.1 K/uL (ref 0.7–4.0)
MCH: 33 pg (ref 26.0–34.0)
MCHC: 36 g/dL (ref 30.0–36.0)
MCV: 91.9 fL (ref 80.0–100.0)
Monocytes Absolute: 1.1 K/uL — ABNORMAL HIGH (ref 0.1–1.0)
Monocytes Relative: 11 %
Neutro Abs: 6.4 K/uL (ref 1.7–7.7)
Neutrophils Relative %: 64 %
Platelets: 233 K/uL (ref 150–400)
RBC: 4.57 MIL/uL (ref 4.22–5.81)
RDW: 11.9 % (ref 11.5–15.5)
WBC: 9.9 K/uL (ref 4.0–10.5)
nRBC: 0 % (ref 0.0–0.2)

## 2023-11-03 LAB — COMPREHENSIVE METABOLIC PANEL WITH GFR
ALT: 36 U/L (ref 0–44)
AST: 44 U/L — ABNORMAL HIGH (ref 15–41)
Albumin: 3.9 g/dL (ref 3.5–5.0)
Alkaline Phosphatase: 68 U/L (ref 38–126)
Anion gap: 13 (ref 5–15)
BUN: 23 mg/dL (ref 8–23)
CO2: 17 mmol/L — ABNORMAL LOW (ref 22–32)
Calcium: 9.3 mg/dL (ref 8.9–10.3)
Chloride: 103 mmol/L (ref 98–111)
Creatinine, Ser: 1.03 mg/dL (ref 0.61–1.24)
GFR, Estimated: >60 mL/min (ref >60)
Glucose, Bld: 101 mg/dL — ABNORMAL HIGH (ref 70–99)
Potassium: 3.8 mmol/L (ref 3.5–5.1)
Sodium: 133 mmol/L — ABNORMAL LOW (ref 135–145)
Total Bilirubin: 0.4 mg/dL (ref 0.0–1.2)
Total Protein: 6.3 g/dL — ABNORMAL LOW (ref 6.5–8.1)

## 2023-11-03 LAB — TROPONIN T, HIGH SENSITIVITY
Troponin T High Sensitivity: <15 ng/L (ref 0–19)
Troponin T High Sensitivity: <15 ng/L (ref 0–19)

## 2023-11-03 LAB — RESP PANEL BY RT-PCR (RSV, FLU A&B, COVID)  RVPGX2
Influenza A by PCR: NEGATIVE
Influenza B by PCR: NEGATIVE
Resp Syncytial Virus by PCR: NEGATIVE
SARS Coronavirus 2 by RT PCR: NEGATIVE

## 2023-11-03 LAB — PRO BRAIN NATRIURETIC PEPTIDE: Pro Brain Natriuretic Peptide: 90.7 pg/mL (ref <300.0)

## 2023-11-03 LAB — D-DIMER, QUANTITATIVE: D-Dimer, Quant: <0.27 ug{FEU}/mL (ref 0.00–0.50)

## 2023-11-03 NOTE — ED Provider Notes (Signed)
 [place] EMERGENCY DEPARTMENT AT [facility] Provider Note   CSN: [id number] Arrival date & time: 11/03/23  9074     Patient presents with: Shortness of Breath   Stephen Davila is a 63 y.o. male.   Patient with history of hypertension, diabetes, controlled COPD --presents to the emergency department for generalized weakness over the past 2 to 3 days with shortness of breath.  Symptoms seem to have been worse with exertion.  He states that his legs feel like they are just going to give out.  He denies any associated fevers, ear pain, runny nose, sore throat.  No cough.  Patient has had some intermittent chest pains, worse in the morning, over the past several weeks.  He had an episode this morning that was more pronounced while in bed.  Denies leg swelling or tenderness. Patient denies risk factors for pulmonary embolism including: unilateral leg swelling, history of DVT/PE/other blood clots, use of exogenous hormones, recent immobilizations, recent surgery, recent travel (>4hr segment), malignancy, hemoptysis.  Patient has a family history of MI in father, heart complication with mother.  He quit smoking 15 years ago.       Prior to Admission medications   Medication Sig Start Date End Date Taking? Authorizing Provider  albuterol  (VENTOLIN  HFA) 108 (90 Base) MCG/ACT inhaler INHALE 1 TO 2 PUFFS INTO THE LUNGS EVERY 6 HOURS AS NEEDED FOR WHEEZING OR SHORTNESS OF BREATH 03/18/22   Nafziger, Darleene, NP  budesonide -formoterol  (SYMBICORT ) 80-4.5 MCG/ACT inhaler INHALE 2 PUFFS BY MOUTH FIRST THING EVERY MORNING, AND 2 PUFFS ABOUT 12 HOURS LATER 10/20/23   Nafziger, Darleene, NP  clobetasol  cream (TEMOVATE ) 0.05 % Apply 1 Application topically 2 (two) times daily. 10/01/22   Nafziger, Darleene, NP  fenofibrate  micronized (LOFIBRA) 134 MG capsule TAKE 1 CAPSULE BY MOUTH EVERY MORNING BEFORE BREAKFAST 10/28/22   Jerilynn Lamarr HERO, NP  omega-3 acid ethyl esters (LOVAZA ) 1 g capsule Orally     [provider]  rosuvastatin  (CRESTOR ) 40 MG tablet Take 1 tablet (40 mg total) by mouth daily. 10/28/22   Jerilynn Lamarr HERO, NP  telmisartan  (MICARDIS ) 40 MG tablet TAKE 1 TABLET BY MOUTH EVERY DAY 10/28/22   Jerilynn Lamarr HERO, NP    Allergies: Patient has no known allergies.    Review of Systems  Updated Vital Signs BP 126/78 (BP Location: Right Arm)   Pulse 82   Temp 97.6 F (36.4 C) (Oral)   Resp 13   SpO2 100%   Physical Exam Vitals and nursing note reviewed.  Constitutional:      Appearance: He is well-developed. He is not diaphoretic.  HENT:     Head: Normocephalic and atraumatic.     Mouth/Throat:     Mouth: Mucous membranes are not dry.  Eyes:     Conjunctiva/sclera: Conjunctivae normal.  Neck:     Vascular: Normal carotid pulses. No carotid bruit or JVD.     Trachea: Trachea normal. No tracheal deviation.  Cardiovascular:     Rate and Rhythm: Normal rate and regular rhythm.     Pulses: No decreased pulses.          Radial pulses are 2+ on the right side and 2+ on the left side.     Heart sounds: Normal heart sounds, S1 normal and S2 normal. Heart sounds not distant. No murmur heard. Pulmonary:     Effort: Pulmonary effort is normal. No respiratory distress.     Breath sounds: Normal breath sounds. No wheezing, rhonchi  or rales.     Comments: Patient seen ambulating in the hallway without assistance.  Upon arriving to the room, he was breathing a little heavier, while getting EKG leads were placed in bed.  No wheezing on exam. Chest:     Chest wall: No tenderness.  Abdominal:     General: Bowel sounds are normal.     Palpations: Abdomen is soft.     Tenderness: There is no abdominal tenderness. There is no guarding or rebound.  Musculoskeletal:     Cervical back: Normal range of motion and neck supple. No muscular tenderness.     Right lower leg: No tenderness. No edema.     Left lower leg: No tenderness. No edema.  Skin:    General: Skin is warm  and dry.     Coloration: Skin is not pale.  Neurological:     Mental Status: He is alert. Mental status is at baseline.  Psychiatric:        Mood and Affect: Mood normal.     (all labs ordered are listed, but only abnormal results are displayed) Labs Reviewed  CBC WITH DIFFERENTIAL/PLATELET - Abnormal; Notable for the following components:      Result Value   Monocytes Absolute 1.1 (*)    All other components within normal limits  COMPREHENSIVE METABOLIC PANEL WITH GFR - Abnormal; Notable for the following components:   Sodium 133 (*)    CO2 17 (*)    Glucose, Bld 101 (*)    Total Protein 6.3 (*)    AST 44 (*)    All other components within normal limits  RESP PANEL BY RT-PCR (RSV, FLU A&B, COVID)  [id number]  PRO BRAIN NATRIURETIC PEPTIDE  D-DIMER, QUANTITATIVE  TROPONIN T, HIGH SENSITIVITY  TROPONIN T, HIGH SENSITIVITY    ED ECG REPORT   Date: 11/03/2023  Rate: 80  Rhythm: normal sinus rhythm  QRS Axis: normal  Intervals: normal  ST/T Wave abnormalities: normal  Conduction Disutrbances:none  Narrative Interpretation: abnl twave transition  Old EKG Reviewed: unchanged 10/2022 other than twi resolved today III  I have personally reviewed the EKG tracing and agree with the computerized printout as noted.   Radiology: DG Chest 2 View Result Date: 11/03/2023 EXAM: 2 VIEW(S) XRAY OF THE CHEST 11/03/2023 10:07:00 AM COMPARISON: None available. CLINICAL HISTORY: SOB. Pt c/o of SOB, shakes, weakness for the past couple days but symptoms worsened today. Pt stated he has been experiencing on and off pain across chest and his legs giving out. Pt stated he has no energy to the point where he has almost passed out. Hx of COPD. FINDINGS: LUNGS AND PLEURA: No focal pulmonary opacity. No pulmonary edema. No pleural effusion. No pneumothorax. HEART AND MEDIASTINUM: No acute abnormality of the cardiac and mediastinal silhouettes. Aortic atherosclerosis. BONES AND SOFT TISSUES: No acute  osseous abnormality. Cervical spine fusion hardware noted. Multilevel degenerative disc disease of thoracic spine. IMPRESSION: 1. No acute findings. Electronically signed by: Waddell Calk MD 11/03/2023 11:07 AM EDT RP Workstation: [id number]     Procedures   Medications Ordered in the ED - No data to display  ED Course  Patient seen and examined. History obtained directly from patient.   Labs/EKG: Ordered CBC, CMP, BNP, D-dimer, troponin.  Imaging: Ordered chest x-ray.  Medications/Fluids: None ordered  Most recent vital signs reviewed and are as follows: BP 126/78 (BP Location: Right Arm)   Pulse 82   Temp 97.6 F (36.4 C) (Oral)   Resp 13  SpO2 100%   Initial impression: Weakness/shortness of breath, broad differential.  Will evaluate for infectious and cardiac etiology, rule out PE.  Patient low risk Wells criteria.  12:19 PM Reassessment performed. Patient appears stable. Lying comfortably in bed. Patient discussed with and seen by Dr. Pamella.   Labs personally reviewed and interpreted including: CBC unremarkable with normal white blood cell count and hemoglobin; CMP sodium slightly low at 133, bicarb 17 with normal anion gap, AST slightly elevated at 44 otherwise unremarkable; BNP is normal; troponin normal; D-dimer negative.  COVID, flu, RSV testing negative.  Imaging personally visualized and interpreted including: Chest x-ray no acute findings.  Reviewed pertinent lab work and imaging with patient at bedside. Questions answered.   Most current vital signs reviewed and are as follows: BP 134/78   Pulse 63   Temp 97.6 F (36.4 C) (Oral)   Resp 13   SpO2 100%   Plan: Second troponin, will ambulate with pulse ox.  1:52 PM Reassessment performed. Patient appears stable, comfortable.  No recurrent symptoms.  He ambulated without shortness of breath or desaturation.  Labs personally reviewed and interpreted including: Second troponin also less than 15.  Reviewed  pertinent lab work and imaging with patient at bedside. Questions answered.   Most current vital signs reviewed and are as follows: BP 121/74   Pulse 70   Temp 97.6 F (36.4 C) (Oral)   Resp 15   SpO2 97%   Plan: Discharge to home.   Prescriptions written for: None  Other home care instructions discussed: Rest, avoidance of triggers  Return and follow-up instructions: I encouraged patient to return to ED with severe chest pain, especially if the pain is crushing or pressure-like and spreads to the arms, back, neck, or jaw, or if they have associated sweating, vomiting, or shortness of breath with the pain, or significant pain with activity. We discussed that the evaluation here today indicates a low-risk of serious cause of chest pain, including heart trouble or a blood clot, but no evaluation is perfect and chest pain can evolve with time. The patient verbalized understanding and agreed.  He is established with Dr. Francyne with cardiology, referral placed requesting follow-up.                                   Medical Decision Making Amount and/or Complexity of Data Reviewed Labs: ordered. Radiology: ordered.   For this patient's complaint of chest pain, the following emergent conditions were considered on the differential diagnosis: acute coronary syndrome, pulmonary embolism, pneumothorax, myocarditis, pericardial tamponade, aortic dissection, thoracic aortic aneurysm complication, esophageal perforation.   Other causes were also considered including: gastroesophageal reflux disease, musculoskeletal pain including costochondritis, pneumonia/pleurisy, herpes zoster, pericarditis.  In regards to possibility of ACS, patient has atypical features of pain, non-ischemic and unchanged EKG and negative troponin(s). Heart score was calculated to be moderate.   In regards to possibility of PE, symptoms are atypical for PE and risk profile is low, d-dimer negative.   The patient's vital  signs, pertinent lab work and imaging were reviewed and interpreted as discussed in the ED course. Hospitalization was considered for further testing, treatments, or serial exams/observation. However as patient is well-appearing, has a stable exam, and reassuring studies today, I do not feel that they warrant admission at this time. This plan was discussed with the patient who verbalizes agreement and comfort with this plan and seems reliable and  able to return to the Emergency Department with worsening or changing symptoms.         Final diagnoses:  Shortness of breath  Precordial pain    ED Discharge Orders          Ordered    Ambulatory referral to Cardiology       Comments: If you have not heard from the Cardiology office within the next 72 hours please call [phone].   11/03/23 1351               Desiderio Chew, PA-C 11/03/23 1358    Pamella Sharper A, DO 11/04/23 1640

## 2023-11-03 NOTE — Discharge Instructions (Signed)
 Please read and follow all provided instructions.  Your diagnoses today include:  1. Shortness of breath   2. Precordial pain     Tests performed today include: An EKG of your heart A chest x-ray: No concerning findings Cardiac enzymes: a blood test for heart muscle damage, were both normal Blood counts and electrolytes Screening test for blood clot and for heart failure: Were normal Vital signs. See below for your results today.   Medications prescribed:  None  Take any prescribed medications only as directed.  Follow-up instructions: Please follow-up with your primary care provider or the cardiology referral as soon as you can for further evaluation of your symptoms.   Return instructions:  SEEK IMMEDIATE MEDICAL ATTENTION IF: You have severe chest pain, especially if the pain is crushing or pressure-like and spreads to the arms, back, neck, or jaw, or if you have sweating, nausea or vomiting, or trouble with breathing. THIS IS AN EMERGENCY. Do not wait to see if the pain will go away. Get medical help at once. Call 911. DO NOT drive yourself to the hospital.  Your chest pain gets worse and does not go away after a few minutes of rest.  You have an attack of chest pain lasting longer than what you usually experience.  You have significant dizziness, if you pass out, or have trouble walking.  You have chest pain not typical of your usual pain for which you originally saw your caregiver.  You have any other emergent concerns regarding your health.  Additional Information: Chest pain comes from many different causes. Your caregiver has diagnosed you as having chest pain that is not specific for one problem, but does not require admission.  You are at low risk for an acute heart condition or other serious illness.   Your vital signs today were: BP 121/74   Pulse 70   Temp 97.6 F (36.4 C) (Oral)   Resp 15   SpO2 97%  If your blood pressure (BP) was elevated above 135/85 this  visit, please have this repeated by your doctor within one month. --------------

## 2023-11-03 NOTE — ED Notes (Signed)
 Discharge instructions and follow up care with cardiology reviewed and explained, pt verbalized understanding and had no further questions on d/c.

## 2023-11-03 NOTE — ED Triage Notes (Incomplete)
 C/o exertional SHOB since yesterday. Reports productive cough as well.

## 2023-11-03 NOTE — Progress Notes (Signed)
 Ambulated with patient to monitor SPO2. A couple of rounds around the ER and his SPO2 did not decrease below 98% HR 82. He denied any SHOB while ambulating. Returned to room and connected patient back to the monitor.His HR was 82, SPO2 100% BP 142/68. RR 11. No distress noted.

## 2023-11-04 ENCOUNTER — Other Ambulatory Visit: Payer: Self-pay | Admitting: Adult Health

## 2023-11-04 DIAGNOSIS — I2510 Atherosclerotic heart disease of native coronary artery without angina pectoris: Secondary | ICD-10-CM

## 2023-11-04 DIAGNOSIS — I10 Essential (primary) hypertension: Secondary | ICD-10-CM

## 2023-11-16 DIAGNOSIS — Z0100 Encounter for examination of eyes and vision without abnormal findings: Secondary | ICD-10-CM | POA: Diagnosis not present

## 2023-11-20 DIAGNOSIS — D23112 Other benign neoplasm of skin of right lower eyelid, including canthus: Secondary | ICD-10-CM | POA: Diagnosis not present

## 2023-11-27 ENCOUNTER — Telehealth: Admitting: Physician Assistant

## 2023-11-27 DIAGNOSIS — J440 Chronic obstructive pulmonary disease with acute lower respiratory infection: Secondary | ICD-10-CM | POA: Diagnosis not present

## 2023-11-27 DIAGNOSIS — J209 Acute bronchitis, unspecified: Secondary | ICD-10-CM | POA: Diagnosis not present

## 2023-11-27 MED ORDER — AZITHROMYCIN 250 MG PO TABS: ORAL_TABLET | ORAL | 0 refills | Status: AC

## 2023-11-27 NOTE — Patient Instructions (Signed)
  Stephen Davila, thank you for joining Harlene PEDLAR Ward, PA-C for today's virtual visit.  While this provider is not your primary care provider (PCP), if your PCP is located in our provider database this encounter information will be shared with them immediately following your visit.   A [facility] MyChart account gives you access to today's visit and all your visits, tests, and labs performed at [facility]  click here if you don't have a [facility] MyChart account or go to [email or web address]  Consent: (Patient) Stephen Davila provided verbal consent for this virtual visit at the beginning of the encounter.  Current Medications:  Current Outpatient Medications:    azithromycin  (ZITHROMAX ) 250 MG tablet, Take 2 tablets on day 1, then 1 tablet daily on days 2 through 5, Disp: 6 tablet, Rfl: 0   albuterol  (VENTOLIN  HFA) 108 (90 Base) MCG/ACT inhaler, INHALE 1 TO 2 PUFFS INTO THE LUNGS EVERY 6 HOURS AS NEEDED FOR WHEEZING OR SHORTNESS OF BREATH, Disp: 8.5 g, Rfl: 0   budesonide -formoterol  (SYMBICORT ) 80-4.5 MCG/ACT inhaler, INHALE 2 PUFFS BY MOUTH FIRST THING EVERY MORNING, AND 2 PUFFS ABOUT 12 HOURS LATER, Disp: 10.2 each, Rfl: 3   clobetasol  cream (TEMOVATE ) 0.05 %, Apply 1 Application topically 2 (two) times daily., Disp: 30 g, Rfl: 2   fenofibrate  micronized (LOFIBRA) 134 MG capsule, TAKE 1 CAPSULE BY MOUTH EVERY MORNING BEFORE BREAKFAST, Disp: 30 capsule, Rfl: 0   omega-3 acid ethyl esters (LOVAZA ) 1 g capsule, Orally, Disp: , Rfl:    rosuvastatin  (CRESTOR ) 40 MG tablet, TAKE 1 TABLET BY MOUTH DAILY, Disp: 30 tablet, Rfl: 0   telmisartan  (MICARDIS ) 40 MG tablet, TAKE 1 TABET BY MOUTH EVERY DAY, Disp: 30 tablet, Rfl: 0   Medications ordered in this encounter:  Meds ordered this encounter  Medications   azithromycin  (ZITHROMAX ) 250 MG tablet    Sig: Take 2 tablets on day 1, then 1 tablet daily on days 2 through 5    Dispense:  6 tablet    Refill:  0     Supervising Provider:   LAMPTEY, PHILIP O [[id number]]     *If you need refills on other medications prior to your next appointment, please contact your pharmacy*  Follow-Up: Call back or seek an in-person evaluation if the symptoms worsen or if the condition fails to improve as anticipated.  [facility] [phone]  Other Instructions Take antibiotics as prescribed.  Use inhaler as needed.  If no improvement or symptoms become worse follow up for in person evaluation.    If you have been instructed to have an in-person evaluation today at a local Urgent Care facility, please use the link below. It will take you to a list of all of our available [facility], including address, phone number and hours of operation. Please do not delay care.  [facility]  If you or a family member do not have a primary care provider, use the link below to schedule a visit and establish care. When you choose a [facility] primary care physician or advanced practice provider, you gain a long-term partner in health. Find a Primary Care Provider  Learn more about [facility]'s in-office and virtual care options: [facility] - Get Care Now

## 2023-11-27 NOTE — Progress Notes (Signed)
 Virtual Visit Consent   Stephen Davila, you are scheduled for a virtual visit with a [facility] provider today. Just as with appointments in the office, your consent must be obtained to participate. Your consent will be active for this visit and any virtual visit you may have with one of our providers in the next 365 days. If you have a MyChart account, a copy of this consent can be sent to you electronically.  As this is a virtual visit, video technology does not allow for your provider to perform a traditional examination. This may limit your provider's ability to fully assess your condition. If your provider identifies any concerns that need to be evaluated in person or the need to arrange testing (such as labs, EKG, etc.), we will make arrangements to do so. Although advances in technology are sophisticated, we cannot ensure that it will always work on either your end or our end. If the connection with a video visit is poor, the visit may have to be switched to a telephone visit. With either a video or telephone visit, we are not always able to ensure that we have a secure connection.  By engaging in this virtual visit, you consent to the provision of healthcare and authorize for your insurance to be billed (if applicable) for the services provided during this visit. Depending on your insurance coverage, you may receive a charge related to this service.  I need to obtain your verbal consent now. Are you willing to proceed with your visit today? Stephen Davila has provided verbal consent on 11/27/2023 for a virtual visit (video or telephone). Harlene PEDLAR Ward, PA-C  Date: 11/27/2023 7:35 PM   Virtual Visit via Video Note   I, Harlene PEDLAR Ward, connected with  Stephen Davila  ([id number], Sep 30, 1960) on 11/27/23 at  7:30 PM EDT by a video-enabled telemedicine application and verified that I am speaking with the correct person using two identifiers.  Location: Patient:  Virtual Visit Location Patient: Home Provider: Virtual Visit Location Provider: Home Office   I discussed the limitations of evaluation and management by telemedicine and the availability of in person appointments. The patient expressed understanding and agreed to proceed.    History of Present Illness: Stephen Davila is a 63 y.o. who identifies as a male who was assigned male at birth, and is being seen today for cough, congestion that started about two weeks ago.  Reports some shortness of breath intermittently.  Denies fever, chills.  Has been using inhalers and taking mucinex with minimal improvement.   HPI: HPI  Problems:  Patient Active Problem List   Diagnosis Date Noted   Upper airway cough syndrome 04/18/2021   Essential hypertension 07/02/2015   Hypertriglyceridemia 01/19/2014   Discoid eczema 03/30/2013   Coronary atherosclerosis of native coronary artery 11/03/2012   Hyperlipidemia with target LDL less than 100 11/03/2012   Routine general medical examination at a health care facility 10/29/2011   COPD GOLD 2 06/20/2009    Allergies: No Known Allergies Medications:  Current Outpatient Medications:    azithromycin  (ZITHROMAX ) 250 MG tablet, Take 2 tablets on day 1, then 1 tablet daily on days 2 through 5, Disp: 6 tablet, Rfl: 0   albuterol  (VENTOLIN  HFA) 108 (90 Base) MCG/ACT inhaler, INHALE 1 TO 2 PUFFS INTO THE LUNGS EVERY 6 HOURS AS NEEDED FOR WHEEZING OR SHORTNESS OF BREATH, Disp: 8.5 g, Rfl: 0   budesonide -formoterol  (SYMBICORT ) 80-4.5 MCG/ACT inhaler, INHALE 2 PUFFS BY MOUTH FIRST  THING EVERY MORNING, AND 2 PUFFS ABOUT 12 HOURS LATER, Disp: 10.2 each, Rfl: 3   clobetasol  cream (TEMOVATE ) 0.05 %, Apply 1 Application topically 2 (two) times daily., Disp: 30 g, Rfl: 2   fenofibrate  micronized (LOFIBRA) 134 MG capsule, TAKE 1 CAPSULE BY MOUTH EVERY MORNING BEFORE BREAKFAST, Disp: 30 capsule, Rfl: 0   omega-3 acid ethyl esters (LOVAZA ) 1 g capsule, Orally, Disp: ,  Rfl:    rosuvastatin  (CRESTOR ) 40 MG tablet, TAKE 1 TABLET BY MOUTH DAILY, Disp: 30 tablet, Rfl: 0   telmisartan  (MICARDIS ) 40 MG tablet, TAKE 1 TABET BY MOUTH EVERY DAY, Disp: 30 tablet, Rfl: 0  Observations/Objective: Patient is well-developed, well-nourished in no acute distress.  Resting comfortably at home.  Head is normocephalic, atraumatic.  No labored breathing.  Speech is clear and coherent with logical content.  Patient is alert and oriented at baseline.    Assessment and Plan: 1. Acute bronchitis with COPD (HCC) (Primary)  Will prescribe antibiotic.  Advised to continue with inhaler as needed.  If no improvement advised follow up for in person evaluation.   Follow Up Instructions: I discussed the assessment and treatment plan with the patient. The patient was provided an opportunity to ask questions and all were answered. The patient agreed with the plan and demonstrated an understanding of the instructions.  A copy of instructions were sent to the patient via MyChart unless otherwise noted below.     The patient was advised to call back or seek an in-person evaluation if the symptoms worsen or if the condition fails to improve as anticipated.    Harlene PEDLAR Ward, PA-C

## 2023-12-02 ENCOUNTER — Other Ambulatory Visit: Payer: Self-pay | Admitting: Cardiovascular Disease

## 2023-12-02 DIAGNOSIS — I2510 Atherosclerotic heart disease of native coronary artery without angina pectoris: Secondary | ICD-10-CM

## 2023-12-02 DIAGNOSIS — I10 Essential (primary) hypertension: Secondary | ICD-10-CM

## 2023-12-15 ENCOUNTER — Other Ambulatory Visit: Payer: Self-pay | Admitting: Cardiovascular Disease

## 2023-12-15 DIAGNOSIS — I2510 Atherosclerotic heart disease of native coronary artery without angina pectoris: Secondary | ICD-10-CM

## 2023-12-15 DIAGNOSIS — I10 Essential (primary) hypertension: Secondary | ICD-10-CM

## 2024-01-11 ENCOUNTER — Encounter: Payer: Self-pay | Admitting: Radiology

## 2024-01-12 DIAGNOSIS — C44311 Basal cell carcinoma of skin of nose: Secondary | ICD-10-CM | POA: Diagnosis not present

## 2024-01-12 DIAGNOSIS — D485 Neoplasm of uncertain behavior of skin: Secondary | ICD-10-CM | POA: Diagnosis not present

## 2024-02-16 DIAGNOSIS — C44311 Basal cell carcinoma of skin of nose: Secondary | ICD-10-CM | POA: Diagnosis not present

## 2024-03-08 ENCOUNTER — Ambulatory Visit: Attending: Cardiovascular Disease | Admitting: Cardiovascular Disease

## 2024-03-08 ENCOUNTER — Encounter: Payer: Self-pay | Admitting: Cardiovascular Disease

## 2024-03-08 VITALS — BP 133/63 | HR 65 | Ht 71.0 in | Wt 157.0 lb

## 2024-03-08 DIAGNOSIS — I2510 Atherosclerotic heart disease of native coronary artery without angina pectoris: Secondary | ICD-10-CM

## 2024-03-08 DIAGNOSIS — E785 Hyperlipidemia, unspecified: Secondary | ICD-10-CM

## 2024-03-08 DIAGNOSIS — I10 Essential (primary) hypertension: Secondary | ICD-10-CM

## 2024-03-08 NOTE — Patient Instructions (Signed)
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
## 2024-03-09 ENCOUNTER — Other Ambulatory Visit: Payer: Self-pay | Admitting: Cardiovascular Disease

## 2024-03-09 DIAGNOSIS — I2510 Atherosclerotic heart disease of native coronary artery without angina pectoris: Secondary | ICD-10-CM

## 2024-03-09 DIAGNOSIS — I10 Essential (primary) hypertension: Secondary | ICD-10-CM
